# Patient Record
Sex: Male | Born: 1960
Health system: Southern US, Community
[De-identification: ages and names within clinical notes are randomized; demographics above are authoritative.]

## PROBLEM LIST (undated history)

## (undated) DIAGNOSIS — R0789 Other chest pain: Secondary | ICD-10-CM

## (undated) DIAGNOSIS — E7800 Pure hypercholesterolemia, unspecified: Secondary | ICD-10-CM

## (undated) DIAGNOSIS — K219 Gastro-esophageal reflux disease without esophagitis: Secondary | ICD-10-CM

## (undated) DIAGNOSIS — R51 Headache: Secondary | ICD-10-CM

## (undated) DIAGNOSIS — G8929 Other chronic pain: Secondary | ICD-10-CM

## (undated) DIAGNOSIS — R569 Unspecified convulsions: Secondary | ICD-10-CM

## (undated) HISTORY — PX: MENISCUS REPAIR: SHX5179

## (undated) HISTORY — DX: Unspecified convulsions: R56.9

## (undated) HISTORY — DX: Other chest pain: R07.89

## (undated) HISTORY — DX: Pure hypercholesterolemia, unspecified: E78.00

## (undated) HISTORY — PX: ANKLE SURGERY: SHX546

## (undated) HISTORY — DX: Headache: R51

## (undated) HISTORY — PX: FINGER SURGERY: SHX640

## (undated) HISTORY — DX: Gastro-esophageal reflux disease without esophagitis: K21.9

## (undated) HISTORY — DX: Other chronic pain: G89.29

---

## 2002-02-19 ENCOUNTER — Other Ambulatory Visit: Admission: RE | Admit: 2002-02-19 | Discharge: 2002-02-19 | Payer: Self-pay | Admitting: Urology

## 2002-08-09 ENCOUNTER — Emergency Department (HOSPITAL_COMMUNITY): Admission: EM | Admit: 2002-08-09 | Discharge: 2002-08-09 | Payer: Self-pay | Admitting: Emergency Medicine

## 2002-08-09 ENCOUNTER — Encounter: Payer: Self-pay | Admitting: Emergency Medicine

## 2008-02-12 ENCOUNTER — Ambulatory Visit: Payer: Self-pay | Admitting: Family Medicine

## 2008-07-13 ENCOUNTER — Ambulatory Visit: Payer: Self-pay | Admitting: Family Medicine

## 2008-07-15 ENCOUNTER — Ambulatory Visit (HOSPITAL_BASED_OUTPATIENT_CLINIC_OR_DEPARTMENT_OTHER): Admission: RE | Admit: 2008-07-15 | Discharge: 2008-07-15 | Payer: Self-pay | Admitting: *Deleted

## 2009-02-05 ENCOUNTER — Ambulatory Visit: Payer: Self-pay | Admitting: Family Medicine

## 2009-05-10 ENCOUNTER — Ambulatory Visit: Admission: RE | Admit: 2009-05-10 | Discharge: 2009-05-10 | Payer: Self-pay | Admitting: Orthopedic Surgery

## 2009-05-10 ENCOUNTER — Ambulatory Visit: Payer: Self-pay | Admitting: Vascular Surgery

## 2009-05-10 ENCOUNTER — Encounter (INDEPENDENT_AMBULATORY_CARE_PROVIDER_SITE_OTHER): Payer: Self-pay | Admitting: Orthopedic Surgery

## 2009-12-07 ENCOUNTER — Ambulatory Visit: Payer: Self-pay | Admitting: Family Medicine

## 2011-03-09 ENCOUNTER — Ambulatory Visit (INDEPENDENT_AMBULATORY_CARE_PROVIDER_SITE_OTHER): Payer: BC Managed Care – PPO | Admitting: Family Medicine

## 2011-03-09 DIAGNOSIS — L258 Unspecified contact dermatitis due to other agents: Secondary | ICD-10-CM

## 2011-05-09 NOTE — Op Note (Signed)
NAMEFENTON, CANDEE NO.:  [id number]   MEDICAL RECORD NO.:  [id number]          PATIENT TYPE:  AMB   LOCATION:  DSC                          FACILITY:  [facility]   PHYSICIAN:  Tennis Must Meyerdierks, M.D.DATE OF BIRTH:  1961/01/27   DATE OF PROCEDURE:  07/15/2008  DATE OF DISCHARGE:                               OPERATIVE REPORT   PREOPERATIVE DIAGNOSIS:  Lacerated ulnar digital artery and nerve, left  index finger.   POSTOPERATIVE DIAGNOSIS:  Lacerated ulnar digital artery and nerve, left  index finger.   PROCEDURE:  Repair of ulnar digital artery and nerve, left index finger.   SURGEON:  Lowell Bouton, MD   ANESTHESIA:  General.   OPERATIVE FINDINGS:  The patient had an oblique laceration over the  ulnar volar aspect of the PIP joint of the left index finger.  His  flexor tendons were both intact, and his ulnar digital artery and nerve  were completely transected.   PROCEDURE IN DETAIL:  Under general anesthesia with the tourniquet on  the left arm, the left hand was prepped and draped in the usual fashion,  and after exsanguinating the limb, the tourniquet was inflated to 250  mmHg.  The laceration was extended in a zigzag fashion proximally and  distally, and blunt dissection was carried down through the subcutaneous  tissues.  The 4-0 silk retention sutures were placed in the flaps for  retraction.  Blunt dissection was carried down to the neurovascular  bundle, which was easily identified and was found to be completely  transected.  The microscope was then brought into the field, and the  artery was cleaned of its adventitia.  It was dilated with __________  solution and a dilator.  The ends of the artery were cut back to good  intima.  The digital nerve likewise was trimmed back to good fascicles  using the straight microscissors.  Using the microscope, the digital  artery was repaired with 9-0 nylon interrupted sutures.  It was  repaired  circumferentially, and there did not appear to be any gaps in the  repair.  The ulnar digital nerve was then repaired using 9-0 nylon  interrupted sutures.  This was repaired circumferentially also.  The  tourniquet was then released.  Venous bleeding was controlled with  electrocautery.  There was good circulation to the finger and no leakage  from the anastomosis.  The wound was then closed with 4-0 nylon sutures.  Sterile dressings were applied followed by an Alumafoam splint with the  finger in slight flexion at the PIP joint.  The patient tolerated the  procedure well and went to the recovery room awake, in stable and good  condition.      Lowell Bouton, M.D.  Electronically Signed    EMM/MEDQ  D:  07/15/2008  T:  07/16/2008  Job:  [id number]   cc:   Sharlot Gowda, M.D.

## 2011-09-22 LAB — POCT HEMOGLOBIN-HEMACUE: Hemoglobin: 14.3

## 2011-09-25 ENCOUNTER — Emergency Department (HOSPITAL_COMMUNITY): Payer: BC Managed Care – PPO

## 2011-09-25 ENCOUNTER — Observation Stay (HOSPITAL_COMMUNITY)
Admission: EM | Admit: 2011-09-25 | Discharge: 2011-09-25 | Disposition: A | Discharge status: Home / Self Care | Payer: BC Managed Care – PPO | Source: Ambulatory Visit | Attending: Emergency Medicine | Admitting: Emergency Medicine

## 2011-09-25 DIAGNOSIS — R079 Chest pain, unspecified: Principal | ICD-10-CM | POA: Insufficient documentation

## 2011-09-25 LAB — CK TOTAL AND CKMB (NOT AT ARMC)
CK, MB: 3.6 ng/mL (ref 0.3–4.0)
Relative Index: 2.4 (ref 0.0–2.5)
Relative Index: 2.5 (ref 0.0–2.5)
Total CK: 126 U/L (ref 7–232)

## 2011-09-25 LAB — POCT I-STAT TROPONIN I
Troponin i, poc: 0 ng/mL (ref 0.00–0.08)
Troponin i, poc: 0 ng/mL (ref 0.00–0.08)

## 2011-09-25 LAB — POCT I-STAT, CHEM 8
BUN: 25 mg/dL — ABNORMAL HIGH (ref 6–23)
Chloride: 106 mEq/L (ref 96–112)
Creatinine, Ser: 1.1 mg/dL (ref 0.50–1.35)
Glucose, Bld: 89 mg/dL (ref 70–99)
HCT: 38 % — ABNORMAL LOW (ref 39.0–52.0)
Hemoglobin: 12.9 g/dL — ABNORMAL LOW (ref 13.0–17.0)
Potassium: 3.6 mEq/L (ref 3.5–5.1)
Sodium: 141 mEq/L (ref 135–145)

## 2011-09-25 LAB — CBC
HCT: 37.4 % — ABNORMAL LOW (ref 39.0–52.0)
Hemoglobin: 12.9 g/dL — ABNORMAL LOW (ref 13.0–17.0)
MCH: 29.5 pg (ref 26.0–34.0)
MCV: 85.6 fL (ref 78.0–100.0)
Platelets: 216 10*3/uL (ref 150–400)
RDW: 12.8 % (ref 11.5–15.5)
WBC: 5.9 10*3/uL (ref 4.0–10.5)

## 2011-09-25 LAB — DIFFERENTIAL
Basophils Absolute: 0 10*3/uL (ref 0.0–0.1)
Basophils Relative: 0 % (ref 0–1)
Eosinophils Absolute: 0.2 10*3/uL (ref 0.0–0.7)
Eosinophils Relative: 4 % (ref 0–5)
Lymphocytes Relative: 39 % (ref 12–46)
Lymphs Abs: 2.3 10*3/uL (ref 0.7–4.0)
Monocytes Relative: 9 % (ref 3–12)
Neutro Abs: 2.8 10*3/uL (ref 1.7–7.7)
Neutrophils Relative %: 48 % (ref 43–77)

## 2011-09-25 MED ORDER — IOHEXOL 350 MG/ML SOLN
80.0000 mL | Freq: Once | INTRAVENOUS | Status: AC | PRN
  Administered 2011-09-25: 80 mL via INTRAVENOUS

## 2011-11-29 ENCOUNTER — Encounter: Payer: Self-pay | Admitting: Nurse Practitioner

## 2011-11-29 ENCOUNTER — Encounter: Payer: Self-pay | Admitting: Medical

## 2011-11-29 ENCOUNTER — Ambulatory Visit (INDEPENDENT_AMBULATORY_CARE_PROVIDER_SITE_OTHER): Payer: BC Managed Care – PPO | Admitting: Medical

## 2011-11-29 ENCOUNTER — Encounter: Payer: BC Managed Care – PPO | Admitting: Nurse Practitioner

## 2011-11-29 VITALS — BP 130/80 | Temp 97.7°F | Wt 196.0 lb

## 2011-11-29 DIAGNOSIS — L259 Unspecified contact dermatitis, unspecified cause: Secondary | ICD-10-CM | POA: Insufficient documentation

## 2011-11-29 DIAGNOSIS — N390 Urinary tract infection, site not specified: Secondary | ICD-10-CM

## 2011-11-29 MED ORDER — PREDNISONE 10 MG PO TABS: ORAL_TABLET | ORAL | Status: DC

## 2011-11-29 NOTE — Patient Instructions (Signed)
Poison Ivy Poison ivy is a inflammation of the skin (contact dermatitis) caused by touching the allergens on the leaves of the ivy plant following previous exposure to the plant. The rash usually appears 48 hours after exposure. The rash is usually bumps (papules) or blisters (vesicles) in a linear pattern. Depending on your own sensitivity, the rash may simply cause redness and itching, or it may also progress to blisters which may break open. These must be well cared for to prevent secondary bacterial (germ) infection, followed by scarring. Keep any open areas dry, clean, dressed, and covered with an antibacterial ointment if needed. The eyes may also get puffy. The puffiness is worst in the morning and gets better as the day progresses. This dermatitis usually heals without scarring, within 2 to 3 weeks without treatment. HOME CARE INSTRUCTIONS  Thoroughly wash with soap and water as soon as you have been exposed to poison ivy. You have about one half hour to remove the plant resin before it will cause the rash. This washing will destroy the oil or antigen on the skin that is causing, or will cause, the rash. Be sure to wash under your fingernails as any plant resin there will continue to spread the rash. Do not rub skin vigorously when washing affected area. Poison ivy cannot spread if no oil from the plant remains on your body. A rash that has progressed to weeping sores will not spread the rash unless you have not washed thoroughly. It is also important to wash any clothes you have been wearing as these may carry active allergens. The rash will return if you wear the unwashed clothing, even several days later. Avoidance of the plant in the future is the best measure. Poison ivy plant can be recognized by the number of leaves. Generally, poison ivy has three leaves with flowering branches on a single stem. Diphenhydramine may be purchased over the counter and used as needed for itching. Do not drive with  this medication if it makes you drowsy.Ask your caregiver about medication for children. SEEK MEDICAL CARE IF:  Open sores develop.   Redness spreads beyond area of rash.   You notice purulent (pus-like) discharge.   You have increased pain.   Other signs of infection develop (such as fever).  Document Released: 12/08/2000 Document Revised: 08/23/2011 Document Reviewed: 10/27/2009 [employer] Patient Information 2012 [employer]. 

## 2011-11-29 NOTE — Progress Notes (Signed)
  Subjective:     Mitchell Ruiz is a 50 y.o. male who presents for evaluation of a rash involving the chest, forearm and trunk. Rash started 2 days ago. Lesions are pink, and raised in texture. Rash has changed over time. Rash is pruritic. Associated symptoms: oozing from left forearm rash. Patient denies: fever and myalgia.  Patient has had new exposures - was out in yard over the weekend pulling weeds, and didn't take a shower that evening.  Next day had rash. He does have hx/o poison ivy reaction and usually has to have shot and steroid taper.  Benadryl makes him jittery.  Review of Systems Pertinent items are noted in HPI.      Objective:    Filed Vitals:   11/29/11 1631  BP: 130/80  Temp: 97.7 F (36.5 C)    General appearance: alert, no distress, WD/WN, male  Skin: several patches of erythema, wheals along left forearm, similar rash on left abdomen and right arm, left forearm rash with serous oozing    Assessment:   Contact Dermatitis    Plan:   Depo Medrol 80 mg IM in office.  Script for Prednisone taper.  Avoid triggers, plants.  Call if worse or not improving.

## 2011-12-01 DIAGNOSIS — L259 Unspecified contact dermatitis, unspecified cause: Secondary | ICD-10-CM

## 2011-12-01 MED ORDER — SODIUM CHLORIDE 0.9 % IV SOLN
80.0000 mg | Freq: Once | INTRAVENOUS | Status: AC
  Administered 2011-11-29: 80 mg via INTRAVENOUS

## 2011-12-01 MED ORDER — METHYLPREDNISOLONE ACETATE 40 MG/ML IJ SUSP
80.0000 mg | Freq: Once | INTRAMUSCULAR | Status: AC
  Administered 2011-12-01: 80 mg via INTRAMUSCULAR

## 2011-12-01 NOTE — Progress Notes (Signed)
Addended by: Janeice Robinson on: 12/01/2011 02:13 PM   Modules accepted: Orders

## 2011-12-01 NOTE — Progress Notes (Signed)
Addended by: Janeice Robinson on: 12/01/2011 02:32 PM   Modules accepted: Orders

## 2011-12-07 ENCOUNTER — Encounter: Payer: Self-pay | Admitting: Internal Medicine

## 2011-12-08 ENCOUNTER — Ambulatory Visit (INDEPENDENT_AMBULATORY_CARE_PROVIDER_SITE_OTHER): Payer: BC Managed Care – PPO | Admitting: Family Medicine

## 2011-12-08 ENCOUNTER — Encounter: Payer: Self-pay | Admitting: Family Medicine

## 2011-12-08 VITALS — BP 120/80 | HR 78 | Wt 195.0 lb

## 2011-12-08 DIAGNOSIS — L259 Unspecified contact dermatitis, unspecified cause: Secondary | ICD-10-CM

## 2011-12-08 DIAGNOSIS — Z23 Encounter for immunization: Secondary | ICD-10-CM

## 2011-12-08 MED ORDER — TRIAMCINOLONE ACETONIDE 0.5 % EX CREA: TOPICAL_CREAM | Freq: Three times a day (TID) | CUTANEOUS | Status: DC

## 2011-12-08 MED ORDER — HYDROXYZINE HCL 10 MG PO TABS: 10.0000 mg | ORAL_TABLET | Freq: Three times a day (TID) | ORAL | Status: DC | PRN

## 2011-12-08 NOTE — Progress Notes (Signed)
  Subjective:    Patient ID: Mitchell Ruiz, male    DOB: October 26, 1961, 50 y.o.   MRN: [id number]  HPI He is here for reevaluation of a rash. He has been reexposed to poison ivy and now has some lesions present on his left flank and left forearm.  Review of Systems     Objective:   Physical Exam Erythematous linear lesions are noted on the left flank and a more confluent area 3 x 8 cm is noted on the left forearm.       Assessment & Plan:   1. Contact dermatitis    I will give him triamcinolone cream and Atarax tablet this. He wanted a steroid dose pack however I was reluctant to do it since he recently had one.

## 2011-12-08 NOTE — Patient Instructions (Signed)
Use the cream sparingly. Use the Atarax especially at night.

## 2012-05-10 ENCOUNTER — Ambulatory Visit (INDEPENDENT_AMBULATORY_CARE_PROVIDER_SITE_OTHER): Payer: BC Managed Care – PPO | Admitting: Medical

## 2012-05-10 ENCOUNTER — Encounter: Payer: Self-pay | Admitting: Medical

## 2012-05-10 VITALS — BP 110/80 | HR 80 | Temp 98.4°F | Wt 200.0 lb

## 2012-05-10 DIAGNOSIS — L255 Unspecified contact dermatitis due to plants, except food: Secondary | ICD-10-CM

## 2012-05-10 MED ORDER — HYDROXYZINE HCL 10 MG PO TABS: 10.0000 mg | ORAL_TABLET | Freq: Three times a day (TID) | ORAL | Status: AC | PRN

## 2012-05-10 MED ORDER — METHYLPREDNISOLONE 4 MG PO KIT: PACK | ORAL | Status: AC

## 2012-05-10 MED ORDER — TRIAMCINOLONE ACETONIDE 0.5 % EX CREA: TOPICAL_CREAM | Freq: Three times a day (TID) | CUTANEOUS | Status: DC

## 2012-05-10 NOTE — Progress Notes (Signed)
Subjective:   HPI  Mitchell Ruiz is a 51 y.o. male who presents with possible poison ivy exposure.  He reports hx/o similar.  He notes that he was working to pull weeds few days ago, was wearing shorts and T-shirt, and next day started getting rash that has now spread.  Has rash on arms, legs, right abdomen.  Using nothing currently.  In the past has come here for treatment. No other aggravating or relieving factors.    No other c/o.  The following portions of the patient's history were reviewed and updated as appropriate: allergies, current medications, past family history, past medical history, past social history, past surgical history and problem list.  Past Medical History  Diagnosis Date  . Chest discomfort   . High cholesterol     No Known Allergies   Review of Systems ROS reviewed and was negative other than noted in HPI or above.    Objective:   Physical Exam  General appearance: alert, no distress, WD/WN Skin: scattered linear erythematous lesions somewhat whealed lesions on bilat arms, legs and right lower abdomen   Assessment and Plan :     Encounter Diagnosis  Name Primary?  . Plant dermatitis Yes   Scripts for Medrol Dosepak, hydroxyzine, topical triamcinolone.   Avoid reexposure, call if worse or not improving.

## 2012-07-25 NOTE — ED Provider Notes (Signed)
Order(s) created erroneously. Erroneous order ID: [id number] Order moved by: Lurline Hare Order move date/time: 07/25/2012  2:10 PM Source Patient:    [id number] Source Contact: 11/29/2011 Destination Patient:   [id number] Destination Contact: 10/02/2011

## 2012-08-29 ENCOUNTER — Telehealth: Payer: Self-pay

## 2012-08-29 ENCOUNTER — Encounter: Payer: Self-pay | Admitting: Family Medicine

## 2012-08-29 ENCOUNTER — Ambulatory Visit (INDEPENDENT_AMBULATORY_CARE_PROVIDER_SITE_OTHER): Payer: BC Managed Care – PPO | Admitting: Family Medicine

## 2012-08-29 ENCOUNTER — Encounter: Payer: Self-pay | Admitting: Gastroenterology

## 2012-08-29 VITALS — BP 130/80 | HR 60 | Ht 70.5 in | Wt 194.0 lb

## 2012-08-29 DIAGNOSIS — Z125 Encounter for screening for malignant neoplasm of prostate: Secondary | ICD-10-CM

## 2012-08-29 DIAGNOSIS — Z23 Encounter for immunization: Secondary | ICD-10-CM

## 2012-08-29 DIAGNOSIS — R0789 Other chest pain: Secondary | ICD-10-CM

## 2012-08-29 DIAGNOSIS — Z0000 Encounter for general adult medical examination without abnormal findings: Secondary | ICD-10-CM

## 2012-08-29 DIAGNOSIS — K219 Gastro-esophageal reflux disease without esophagitis: Secondary | ICD-10-CM

## 2012-08-29 DIAGNOSIS — E785 Hyperlipidemia, unspecified: Secondary | ICD-10-CM

## 2012-08-29 LAB — COMPREHENSIVE METABOLIC PANEL
AST: 19 U/L (ref 0–37)
Albumin: 4.5 g/dL (ref 3.5–5.2)
Alkaline Phosphatase: 39 U/L (ref 39–117)
BUN: 20 mg/dL (ref 6–23)
Potassium: 4.5 mEq/L (ref 3.5–5.3)
Sodium: 140 mEq/L (ref 135–145)
Total Bilirubin: 0.6 mg/dL (ref 0.3–1.2)
Total Protein: 7.1 g/dL (ref 6.0–8.3)

## 2012-08-29 LAB — CBC WITH DIFFERENTIAL/PLATELET
Basophils Absolute: 0 10*3/uL (ref 0.0–0.1)
Basophils Relative: 0 % (ref 0–1)
Eosinophils Absolute: 0.1 10*3/uL (ref 0.0–0.7)
MCH: 28.5 pg (ref 26.0–34.0)
MCHC: 33.7 g/dL (ref 30.0–36.0)
Neutro Abs: 3.4 10*3/uL (ref 1.7–7.7)
Neutrophils Relative %: 65 % (ref 43–77)
RDW: 13.4 % (ref 11.5–15.5)

## 2012-08-29 LAB — LIPID PANEL
HDL: 44 mg/dL (ref >39)
LDL Cholesterol: 185 mg/dL — ABNORMAL HIGH (ref 0–99)
Triglycerides: 106 mg/dL (ref <150)
VLDL: 21 mg/dL (ref 0–40)

## 2012-08-29 LAB — PSA: PSA: 0.35 ng/mL (ref <=4.00)

## 2012-08-29 NOTE — Telephone Encounter (Signed)
COLONOSCOPY OCT 28TH AT 10 AM ARRIVE AT 9 AM DR JACOB PRE OP OCT 14 AT 10 30 [place] [phone]

## 2012-08-29 NOTE — Patient Instructions (Signed)
If you have this chest pain again especially after working in the yard take 4 ibuprofen 3 times per day

## 2012-08-29 NOTE — Progress Notes (Signed)
Subjective:    Patient ID: Mitchell Ruiz, male    DOB: 1961/07/21, 51 y.o.   MRN: [id number]  HPI He is here for complete examination. He was seen in October of last year for evaluation of chest discomfort. That record was reviewed and no cardiac issues were identified. He was especially sensitized the cause a friend of his apparently died of heart-related issues in his 29s. He does describe pain when he lays on his chest or on the left side. He also notes increased discomfort after he finishes doing some yard work but he has had no pain with exercise, shortness of breath, diaphoresis. He states pain can be intermittent in nature. He does have reflux and uses Zegerid for this. Review his record also indicates elevated lipid panel. Social and family history were reviewed. His work is going well.   Review of Systems  Constitutional: Negative.   HENT: Negative.   Eyes: Negative.   Respiratory: Negative.   Cardiovascular: Negative.   Gastrointestinal: Negative.   Genitourinary: Negative.   Musculoskeletal: Negative.   Neurological: Negative.   Hematological: Negative.   Psychiatric/Behavioral: Negative.        Objective:   Physical Exam BP 130/80  Pulse 60  Ht 5' 10.5" (1.791 m)  Wt 194 lb (87.998 kg)  BMI 27.44 kg/m2  General Appearance:    Alert, cooperative, no distress, appears stated age  Head:    Normocephalic, without obvious abnormality, atraumatic  Eyes:    PERRL, conjunctiva/corneas clear, EOM's intact, fundi    benign  Ears:    Normal TM's and external ear canals  Nose:   Nares normal, mucosa normal, no drainage or sinus   tenderness  Throat:   Lips, mucosa, and tongue normal; teeth and gums normal  Neck:   Supple, no lymphadenopathy;  thyroid:  no   enlargement/tenderness/nodules; no carotid   bruit or JVD  Back:    Spine nontender, no curvature, ROM normal, no CVA     tenderness  Lungs:     Clear to auscultation bilaterally without wheezes, rales or     ronchi;  respirations unlabored  Chest Wall:    No tenderness or deformity   Heart:    Regular rate and rhythm, S1 and S2 normal, no murmur, rub   or gallop  Breast Exam:    No chest wall tenderness, masses or gynecomastia  Abdomen:     Soft, non-tender, nondistended, normoactive bowel sounds,    no masses, no hepatosplenomegaly  Genitalia:    Normal male external genitalia without lesions.  Testicles without masses.  No inguinal hernias.  Rectal:   deferred   Extremities:   No clubbing, cyanosis or edema  Pulses:   2+ and symmetric all extremities  Skin:   Skin color, texture, turgor normal, no rashes or lesions  Lymph nodes:   Cervical, supraclavicular, and axillary nodes normal  Neurologic:   CNII-XII intact, normal strength, sensation and gait; reflexes 2+ and symmetric throughout          Psych:   Normal mood, affect, hygiene and grooming.           Assessment & Plan:   1. GERD (gastroesophageal reflux disease)    2. Routine general medical examination at a health care facility  Tdap vaccine greater than or equal to 7yo IM, Flu vaccine greater than or equal to 3yo with preservative IM, HM COLONOSCOPY, CBC with Differential, Comprehensive metabolic panel, Lipid panel  3. Special screening for malignant neoplasm of  prostate  PSA  4. Hyperlipidemia LDL goal < 100  Lipid panel  5. Musculoskeletal chest pain     discussed in great detail the musculoskeletal pain. Explained to him that there was absolutely no evidence of any major problems especially since this is intermittent in nature.

## 2012-10-07 ENCOUNTER — Telehealth: Payer: Self-pay | Admitting: *Deleted

## 2012-10-07 NOTE — Telephone Encounter (Signed)
No show for previsit.  Left message on machine and sent no show letter

## 2012-10-21 ENCOUNTER — Other Ambulatory Visit: Payer: BC Managed Care – PPO | Admitting: Gastroenterology

## 2012-10-28 ENCOUNTER — Ambulatory Visit (INDEPENDENT_AMBULATORY_CARE_PROVIDER_SITE_OTHER): Payer: BC Managed Care – PPO | Admitting: Family Medicine

## 2012-10-28 VITALS — BP 120/80 | HR 78 | Temp 98.7°F | Wt 194.0 lb

## 2012-10-28 DIAGNOSIS — R05 Cough: Secondary | ICD-10-CM

## 2012-10-28 DIAGNOSIS — R059 Cough, unspecified: Secondary | ICD-10-CM

## 2012-10-28 MED ORDER — ALBUTEROL SULFATE HFA 108 (90 BASE) MCG/ACT IN AERS: 2.0000 | INHALATION_SPRAY | Freq: Four times a day (QID) | RESPIRATORY_TRACT | Status: DC | PRN

## 2012-10-28 MED ORDER — CLARITHROMYCIN 500 MG PO TABS: 500.0000 mg | ORAL_TABLET | Freq: Two times a day (BID) | ORAL | Status: DC

## 2012-10-28 NOTE — Patient Instructions (Signed)
If you're still having symptoms in 2 weeks, make another appointment

## 2012-10-28 NOTE — Progress Notes (Signed)
  Subjective:    Patient ID: ELIODORO GULLETT, male    DOB: 10/31/1961, 51 y.o.   MRN: [id number]  HPI He has a two-week history that started with a cough but no fever, chills, sore throat. He notes that he is usually fine in the morning but as the day goes on he has more difficulty with coughing. No sneezing, itchy watery eyes, rhinorrhea. He presently is taking Zegerid. He will sometimes take Zantac if he has extra indigestion. Cough is intermittent.   Review of Systems     Objective:   Physical Exam alert and in no distress. Tympanic membranes and canals are normal. Throat is clear. Tonsils are normal. Neck is supple without adenopathy or thyromegaly. Cardiac exam shows a regular sinus rhythm without murmurs or gallops. Lungs are clear to auscultation. Chest x-ray shows no acute changes      Assessment & Plan:   1. Cough  clarithromycin (BIAXIN) 500 MG tablet, albuterol (PROVENTIL HFA;VENTOLIN HFA) 108 (90 BASE) MCG/ACT inhaler, CHEST X-RAY 2 VIEWS (71020)   I explained to him that his cough is really not diagnostic of anything in particular but will she will an antibiotic and using an inhaler will do. If he makes no improvement I will refer to pulmonary

## 2014-02-20 ENCOUNTER — Other Ambulatory Visit: Payer: Self-pay | Admitting: Specialist

## 2014-02-20 DIAGNOSIS — M5412 Radiculopathy, cervical region: Secondary | ICD-10-CM

## 2014-02-20 DIAGNOSIS — R200 Anesthesia of skin: Secondary | ICD-10-CM

## 2014-02-20 DIAGNOSIS — R531 Weakness: Secondary | ICD-10-CM

## 2014-02-24 ENCOUNTER — Other Ambulatory Visit: Payer: BC Managed Care – PPO

## 2014-05-08 ENCOUNTER — Encounter: Payer: Self-pay | Admitting: Family Medicine

## 2014-05-08 ENCOUNTER — Ambulatory Visit (INDEPENDENT_AMBULATORY_CARE_PROVIDER_SITE_OTHER): Payer: BC Managed Care – PPO | Admitting: Family Medicine

## 2014-05-08 VITALS — BP 120/84 | HR 78 | Resp 16 | Ht 70.0 in | Wt 191.0 lb

## 2014-05-08 DIAGNOSIS — G589 Mononeuropathy, unspecified: Secondary | ICD-10-CM

## 2014-05-08 DIAGNOSIS — G629 Polyneuropathy, unspecified: Secondary | ICD-10-CM

## 2014-05-08 NOTE — Progress Notes (Signed)
   Subjective:    Patient ID: Mitchell Ruiz, male    DOB: [birth year], 53 y.o.   MRN: [id number]  HPI He is here for consult concerning difficulty he has had over the last several years. He is being evaluated and treated by a neurologist in [place]. The medical record indicates multiple issues including neuropathy of various kinds and trigger point injections. He is also had x-rays and MRIs done. Recently he was tried on Lyrica however he did not tolerate it. He was recommended to be switched to gabapentin. He is concerned and frustrated over his care and feels as if the neurologist is not giving him the time necessary.   Review of Systems     Objective:   Physical Exam Alert and in no distress otherwise not examined       Assessment & Plan:  Neuropathy - Plan: Ambulatory referral to Neurology  review of the neurology notes does indicate some nonspecific findings and some benefit from trigger point injections however the exact diagnosis from my perspective does not well-defined. I will refer him to neurology to get a second opinion and probably take over his care. I did recommend that he switch over to gabapentin to see if this will help with his pain symptoms.

## 2014-06-10 ENCOUNTER — Ambulatory Visit: Payer: BC Managed Care – PPO | Admitting: Neurology

## 2014-07-17 ENCOUNTER — Telehealth: Payer: Self-pay | Admitting: Neurology

## 2014-07-17 ENCOUNTER — Telehealth: Payer: Self-pay | Admitting: Internal Medicine

## 2014-07-17 NOTE — Telephone Encounter (Signed)
Pt called on 07/17/14 at 10:11AM to cancel his initial appt with Dr. Posey Pronto on 07/20/14. He stated he felt fine and does not feel the need to be seen by a neurologist. I called Ref provider, Dr. Beverly Gust office and spoke w/ Gabriel Cirri to inform her on his cancellation on 07/17/14 at 10:15AM

## 2014-07-17 NOTE — Telephone Encounter (Signed)
Pt cancelled his appt with Dr. Posey Pronto for neurology appt. Just an [place]

## 2014-07-20 ENCOUNTER — Ambulatory Visit: Payer: BC Managed Care – PPO | Admitting: Neurology

## 2014-07-28 ENCOUNTER — Encounter (HOSPITAL_COMMUNITY): Payer: Self-pay | Admitting: Emergency Medicine

## 2014-07-28 ENCOUNTER — Emergency Department (HOSPITAL_COMMUNITY): Payer: BC Managed Care – PPO

## 2014-07-28 ENCOUNTER — Telehealth: Payer: Self-pay | Admitting: Family Medicine

## 2014-07-28 ENCOUNTER — Ambulatory Visit: Payer: BC Managed Care – PPO | Admitting: Family Medicine

## 2014-07-28 ENCOUNTER — Emergency Department (HOSPITAL_COMMUNITY)
Admission: EM | Admit: 2014-07-28 | Discharge: 2014-07-28 | Disposition: A | Discharge status: Home / Self Care | Payer: BC Managed Care – PPO | Attending: Emergency Medicine | Admitting: Emergency Medicine

## 2014-07-28 DIAGNOSIS — Z7982 Long term (current) use of aspirin: Secondary | ICD-10-CM | POA: Insufficient documentation

## 2014-07-28 DIAGNOSIS — R51 Headache: Secondary | ICD-10-CM | POA: Insufficient documentation

## 2014-07-28 DIAGNOSIS — Z79899 Other long term (current) drug therapy: Secondary | ICD-10-CM | POA: Insufficient documentation

## 2014-07-28 DIAGNOSIS — R42 Dizziness and giddiness: Secondary | ICD-10-CM | POA: Insufficient documentation

## 2014-07-28 DIAGNOSIS — R519 Headache, unspecified: Secondary | ICD-10-CM

## 2014-07-28 DIAGNOSIS — Z862 Personal history of diseases of the blood and blood-forming organs and certain disorders involving the immune mechanism: Secondary | ICD-10-CM | POA: Insufficient documentation

## 2014-07-28 DIAGNOSIS — Z8639 Personal history of other endocrine, nutritional and metabolic disease: Secondary | ICD-10-CM | POA: Insufficient documentation

## 2014-07-28 MED ORDER — DEXAMETHASONE SODIUM PHOSPHATE 10 MG/ML IJ SOLN: 10.0000 mg | Freq: Once | INTRAMUSCULAR | Status: DC

## 2014-07-28 MED ORDER — SODIUM CHLORIDE 0.9 % IV BOLUS (SEPSIS)
1000.0000 mL | Freq: Once | INTRAVENOUS | Status: AC
  Administered 2014-07-28: 1000 mL via INTRAVENOUS

## 2014-07-28 MED ORDER — PROCHLORPERAZINE EDISYLATE 5 MG/ML IJ SOLN
10.0000 mg | Freq: Four times a day (QID) | INTRAMUSCULAR | Status: DC | PRN
  Administered 2014-07-28: 10 mg via INTRAVENOUS
  Filled 2014-07-28: qty 2

## 2014-07-28 MED ORDER — ONDANSETRON 4 MG PO TBDP
8.0000 mg | ORAL_TABLET | Freq: Once | ORAL | Status: AC
  Administered 2014-07-28: 8 mg via ORAL
  Filled 2014-07-28: qty 2

## 2014-07-28 MED ORDER — DEXAMETHASONE SODIUM PHOSPHATE 4 MG/ML IJ SOLN
10.0000 mg | Freq: Once | INTRAMUSCULAR | Status: AC
  Administered 2014-07-28: 10 mg via INTRAVENOUS
  Filled 2014-07-28: qty 3

## 2014-07-28 MED ORDER — OXYCODONE-ACETAMINOPHEN 5-325 MG PO TABS
1.0000 | ORAL_TABLET | Freq: Once | ORAL | Status: AC
  Administered 2014-07-28: 1 via ORAL
  Filled 2014-07-28: qty 1

## 2014-07-28 MED ORDER — DIPHENHYDRAMINE HCL 50 MG/ML IJ SOLN
25.0000 mg | Freq: Once | INTRAMUSCULAR | Status: AC
Start: 2014-07-28 — End: 2014-07-28
  Administered 2014-07-28: 25 mg via INTRAVENOUS
  Filled 2014-07-28: qty 1

## 2014-07-28 MED ORDER — STERILE WATER FOR INJECTION IJ SOLN
INTRAMUSCULAR | Status: AC
  Filled 2014-07-28: qty 20

## 2014-07-28 NOTE — Telephone Encounter (Signed)
Pt's wife called back again. I spoke to [facility] and he stated that if pt is too sick to come in the he needs to go to hospital. JCL states can't make any assessment over the phone. Pt's wife was informed of JCL recommendation.

## 2014-07-28 NOTE — Discharge Instructions (Signed)

## 2014-07-28 NOTE — ED Notes (Signed)
Pt transported to CT ?

## 2014-07-28 NOTE — ED Provider Notes (Signed)
CSN: [id number]     Arrival date & time 07/28/14  1615 History   First MD Initiated Contact with Patient 07/28/14 2028     Chief Complaint  Patient presents with  . Nausea  . Headache     (Consider location/radiation/quality/duration/timing/severity/associated sxs/prior Treatment) Patient is a 53 y.o. male presenting with headaches. The history is provided by the patient and the spouse.  Headache Pain location:  R parietal Quality:  Sharp Radiates to:  Does not radiate Severity currently:  8/10 Severity at highest:  10/10 Onset quality:  Gradual Duration:  8 hours Timing:  Constant Progression:  Worsening Chronicity:  New Similar to prior headaches: no   Context: bright light and loud noise   Context: not caffeine   Relieved by:  Nothing Worsened by:  Nothing tried Ineffective treatments:  Aspirin and NSAIDs Associated symptoms: dizziness   Associated symptoms: no abdominal pain, no congestion, no diarrhea, no fever, no myalgias and no vomiting    53 yo male with a significant past medical history of cluster headaches who presents with a chief complaint of headache and vomiting. Patient states this started earlier today as he was heading out for a meeting in another town. Patient states that during the drive he started having a dull right-sided headache which gradually worsened over the course of a couple hours. Patient stated at that point it was 10 out of 10 started having some vomiting with it. Patient went to a pharmacy and took 3 aspirin tablets without relief. Patient continuing to have pain throughout the day he decided to come to the emergency department for evaluation. Patient states that these headaches feel different than the ones he had prior. Patient has been 26 years without any headache. Pain worse with head movement some associated vertigo.  Headache has improved over the past hour or so. Denies dizziness currently. Denies recent head injury.  Past Medical History   Diagnosis Date  . Chest discomfort   . High cholesterol    Past Surgical History  Procedure Laterality Date  . Knee surgery     History reviewed. No pertinent family history. History  Substance Use Topics  . Smoking status: Never Smoker   . Smokeless tobacco: Not on file  . Alcohol Use: Yes     Comment: OCCASIONAL    Review of Systems  Constitutional: Negative for fever and chills.  HENT: Negative for congestion and facial swelling.   Eyes: Negative for discharge and visual disturbance.  Respiratory: Negative for shortness of breath.   Cardiovascular: Negative for chest pain and palpitations.  Gastrointestinal: Negative for vomiting, abdominal pain and diarrhea.  Musculoskeletal: Negative for arthralgias and myalgias.  Skin: Negative for color change and rash.  Neurological: Positive for dizziness and headaches. Negative for tremors and syncope.  Psychiatric/Behavioral: Negative for confusion and dysphoric mood.      Allergies  Review of patient's allergies indicates no known allergies.  Home Medications   Prior to Admission medications   Medication Sig Start Date End Date Taking? Authorizing Provider  ALPRAZolam Duanne Moron) 0.5 MG tablet Take 0.5 mg by mouth at bedtime as needed.  02/18/14  Yes Historical Provider, MD  aspirin 325 MG tablet Take 975 mg by mouth daily.   Yes Historical Provider, MD  omeprazole-sodium bicarbonate (ZEGERID) 40-1100 MG per capsule Take 1 capsule by mouth daily before breakfast.   Yes Historical Provider, MD   BP 104/70  Pulse 58  Resp 12  Ht 6' (1.829 m)  Wt 191 lb [phone]  kg)  BMI 25.90 kg/m2  SpO2 99% Physical Exam  Constitutional: He is oriented to person, place, and time. He appears well-developed and well-nourished.  HENT:  Head: Normocephalic and atraumatic.  Eyes: EOM are normal. Pupils are equal, round, and reactive to light.  Neck: Normal range of motion. Neck supple. No JVD present.  Cardiovascular: Normal rate and regular  rhythm.  Exam reveals no gallop and no friction rub.   No murmur heard. Pulmonary/Chest: No respiratory distress. He has no wheezes.  Abdominal: He exhibits no distension. There is no rebound and no guarding.  Musculoskeletal: Normal range of motion.  Neurological: He is alert and oriented to person, place, and time. He has normal strength. No cranial nerve deficit or sensory deficit. He displays a negative Romberg sign. Coordination and gait normal. GCS eye subscore is 4. GCS verbal subscore is 5. GCS motor subscore is 6.  Reflex Scores:      Tricep reflexes are 2+ on the right side and 2+ on the left side.      Bicep reflexes are 2+ on the right side and 2+ on the left side.      Brachioradialis reflexes are 2+ on the right side and 2+ on the left side.      Patellar reflexes are 2+ on the right side and 2+ on the left side.      Achilles reflexes are 2+ on the right side and 2+ on the left side. Skin: No rash noted. No pallor.  Psychiatric: He has a normal mood and affect. His behavior is normal.    ED Course  Procedures (including critical care time) Labs Review Labs Reviewed - No data to display  Imaging Review Ct Head Wo Contrast  07/28/2014   CLINICAL DATA:  Headache and nausea  EXAM: CT HEAD WITHOUT CONTRAST  TECHNIQUE: Contiguous axial images were obtained from the base of the skull through the vertex without intravenous contrast.  COMPARISON:  None.  FINDINGS: Ventricle size is normal. Negative for acute or chronic infarction. Negative for hemorrhage or fluid collection. Negative for mass or edema. No shift of the midline structures.  Calvarium is intact.  IMPRESSION: Normal   Electronically Signed   By: Franchot Gallo M.D.   On: 07/28/2014 21:48     EKG Interpretation None      MDM   Final diagnoses:  Frontal headache    53 yo male with a chief complaint of headache. Patient headache atypical of prior. Patient with a long time between headaches. Patient with some  associated pain with rotation of the head. We'll obtain a CT head give the patient a migraine cocktail.  Discussion with patient about the possiblity of SAH post 6 hours after CT.  Patient understands risks and benefits and able to talk about them intelligently.  Headache almost completely relieved with cocktail, requesting discharge at this time.   11:36 PM:  I have discussed the diagnosis/risks/treatment options with the patient and family and believe the pt to be eligible for discharge home to follow-up with PCP/neurologist. We also discussed returning to the ED immediately if new or worsening sx occur. We discussed the sx which are most concerning (e.g., neurologic deficit) that necessitate immediate return. Medications administered to the patient during their visit and any new prescriptions provided to the patient are listed below.  Medications given during this visit Medications  prochlorperazine (COMPAZINE) injection 10 mg (10 mg Intravenous Given 07/28/14 2117)  sterile water (preservative free) injection (not administered)  oxyCODONE-acetaminophen (  PERCOCET/ROXICET) 5-325 MG per tablet 1 tablet (1 tablet Oral Given 07/28/14 1645)  ondansetron (ZOFRAN-ODT) disintegrating tablet 8 mg (8 mg Oral Given 07/28/14 1645)  diphenhydrAMINE (BENADRYL) injection 25 mg (25 mg Intravenous Given 07/28/14 2117)  sodium chloride 0.9 % bolus 1,000 mL (0 mLs Intravenous Stopped 07/28/14 2210)  dexamethasone (DECADRON) injection 10 mg (10 mg Intravenous Given 07/28/14 2116)    New Prescriptions   No medications on file     Deno Etienne, MD 07/28/14 2336

## 2014-07-28 NOTE — ED Notes (Signed)
Pt no longer c/o headache.  Pt resting comfortably in bed. No new complaints.  Will continue to monitor.

## 2014-07-28 NOTE — ED Notes (Signed)
Pt reports that this afternoon around 1200 he started having a right sided headache with severe nausea. Denies any blurred vision, or light sensitivity. No neuro deficits.

## 2014-07-28 NOTE — Telephone Encounter (Signed)
Pt's wife called to cancel pt's appt for today. Pt too ill for her to get him here for appt. He has a severe headache and crying in bathroom floor. He is nauseated due to headache pain so cant stay out of bathroom. . Can Dr Redmond School send in med to [facility] to help? Already tried naproxen but it has not helped at all.

## 2014-08-07 NOTE — ED Provider Notes (Signed)
I saw and evaluated the patient, reviewed the resident's note and I agree with the findings and plan.   EKG Interpretation None      Pt comes in with headaches, sudden onset, new and severe. There is no focal neuro deficits on my exam. Pt has no hx of brain AN, and denies brain bleeds or aneurysms in the family. CT head done within 6 hours - and with a sensitivity of 98% for [facility], we decided to monitor the patient and get LP if not better, or any change in her pain. Pt eventually got a lot better, and asked for discharge home.  Varney Biles, MD 08/07/14 [phone]

## 2014-09-11 ENCOUNTER — Ambulatory Visit (INDEPENDENT_AMBULATORY_CARE_PROVIDER_SITE_OTHER): Payer: BC Managed Care – PPO | Admitting: Neurology

## 2014-09-11 ENCOUNTER — Encounter: Payer: Self-pay | Admitting: Neurology

## 2014-09-11 VITALS — BP 130/78 | HR 76 | Ht 71.0 in | Wt 195.0 lb

## 2014-09-11 DIAGNOSIS — R51 Headache: Secondary | ICD-10-CM

## 2014-09-11 DIAGNOSIS — R200 Anesthesia of skin: Secondary | ICD-10-CM | POA: Insufficient documentation

## 2014-09-11 DIAGNOSIS — R209 Unspecified disturbances of skin sensation: Secondary | ICD-10-CM

## 2014-09-11 NOTE — Progress Notes (Signed)
NEUROLOGY CONSULTATION NOTE  Mitchell Ruiz MRN: [id number] DOB: 05-07-1961  Referring provider: Dr. Jill Alexanders Primary care provider: Dr. Jill Alexanders  Reason for consult:  Headache, numbness  Dear Dr Redmond School:  Thank you for your kind referral of Mitchell Ruiz for consultation of the above symptoms. Although his history is well known to you, please allow me to reiterate it for the purpose of our medical record. Records and images were personally reviewed where available.  HISTORY OF PRESENT ILLNESS: This is a very pleasant 53 year old right-handed man with a history of GERD, presenting for numbness and headaches.  He reports that around 5-6 years ago, he started having pain on the left side of his chest, having the seatbelt on or lying on his left side was uncomfortable.  He had a cardiac evaluation which was normal per patient.  He was referred to a neurologist in [place], he tells me MRI brain and spine were unremarkable except for "something close to the vertebra." He received steroid injections in the neck, which did help with the left-sided symptoms.  At some point, he started having intermittent daily paresthesias in both legs, and received more injections. He noticed that hot water and heat seemed to exacerbate his symptoms.  He started having the paresthesias in his arms as well, and started Lyrica which did not seem to work.  He was switched to Gabapentin but "did not like it."  He stopped all medications 3-4 months ago and started feeling better, with no further paresthesias or pain. Last 07/28/2014, he was driving to a meeting when he started having a "tremendous headache" over the left peri-orbital and frontal region, radiating to the right occipital region. Pain was 10/10 with nausea, photophobia. He went to the ER, had a head CT which I personally reviewed which was normal. He was given a migraine cocktail and felt better. Lumbar puncture was discussed to r/o subarachnoid  hemorrhage, which he declined. He had a headache for 7-10 days after and took Naproxen twice a day. Headaches completely resolved after 2 weeks.  He denies any other neurological symptoms since then.  He denies any vision changes/visual obscurations, focal numbness/tingling/weakness, tinnitus, ear fullness. He has a history of migraines 20 years ago, different from the recent headaches.    He reports a history of 2 seizures, one 28 years ago while in a restaurant he started having deja vu, went to the bathroom, then regained consciousness in the car to the hospital. No clear reported convulsive activity. The second seizure occurred 15 years ago, he again recalls having deja vu then no other recollection of the seizure. He reports having an MRI at at that time which was normal.  He denies any further deja vu episodes. He denies any gaps in time, staring/unresponsive episodes, myoclonic jerks, olfactory/gustatory hallucinations.  He denies any dizziness, diplopia, dysarthria, dysphagia, neck pain, bowel/bladder dysfunction. He has low back pain with numbness radiating behind his right leg. He reports sleep can be better, usually with 5-6 hours of sleep, occasional snoring. His brother was diagnosed with MS 1-1/2 years ago. No family history of cerebral aneurysms or migraines.  He usually drinks 1-2 glasses of wine a few nights a week.  He reports having B12 shots last year and was treated for 21 days with antiviral due to "positive shingles test," no rash.  He had a normal birth and early development, no history of CNS infections, febrile convulsions, significant traumatic brain injury, family history of seizures.  PAST MEDICAL HISTORY: Past Medical History  Diagnosis Date  . Chest discomfort   . High cholesterol   . GERD (gastroesophageal reflux disease)     PAST SURGICAL HISTORY: Past Surgical History  Procedure Laterality Date  . Knee surgery Left   . Ankle surgery Right   . Finger surgery Left       nerve damage    MEDICATIONS: Current Outpatient Prescriptions on File Prior to Visit  Medication Sig Dispense Refill  . omeprazole-sodium bicarbonate (ZEGERID) 40-1100 MG per capsule Take 1 capsule by mouth daily before breakfast.       No current facility-administered medications on file prior to visit.    ALLERGIES: No Known Allergies  FAMILY HISTORY: Family History  Problem Relation Age of Onset  . Multiple sclerosis Brother     SOCIAL HISTORY: History   Social History  . Marital Status: Married    Spouse Name: N/A    Number of Children: N/A  . Years of Education: N/A   Occupational History  . Not on file.   Social History Main Topics  . Smoking status: Never Smoker   . Smokeless tobacco: Not on file  . Alcohol Use: Yes     Comment: OCCASIONAL  . Drug Use: No  . Sexual Activity: Yes   Other Topics Concern  . Not on file   Social History Narrative  . No narrative on file    REVIEW OF SYSTEMS: Constitutional: No fevers, chills, or sweats, no generalized fatigue, change in appetite Eyes: No visual changes, double vision, eye pain Ear, nose and throat: No hearing loss, ear pain, nasal congestion, sore throat Cardiovascular: No chest pain, palpitations Respiratory:  No shortness of breath at rest or with exertion, wheezes GastrointestinaI: No nausea, vomiting, diarrhea, abdominal pain, fecal incontinence Genitourinary:  No dysuria, urinary retention or frequency Musculoskeletal:  No neck pain, + back pain Integumentary: No rash, pruritus, skin lesions Neurological: as above Psychiatric: No depression, insomnia, anxiety Endocrine: No palpitations, fatigue, diaphoresis, mood swings, change in appetite, change in weight, increased thirst Hematologic/Lymphatic:  No anemia, purpura, petechiae. Allergic/Immunologic: no itchy/runny eyes, nasal congestion, recent allergic reactions, rashes  PHYSICAL EXAM: Filed Vitals:   09/11/14 0752  BP: 130/78  Pulse:  76   General: No acute distress Head:  Normocephalic/atraumatic Eyes: Fundoscopic exam shows bilateral sharp discs, no vessel changes, exudates, or hemorrhages Neck: supple, no paraspinal tenderness, full range of motion Back: No paraspinal tenderness Heart: regular rate and rhythm Lungs: Clear to auscultation bilaterally. Vascular: No carotid bruits. Skin/Extremities: No rash, no edema Neurological Exam: Mental status: alert and oriented to person, place, and time, no dysarthria or aphasia, Fund of knowledge is appropriate.  Recent and remote memory are intact.  Attention and concentration are normal.    Able to name objects and repeat phrases. Cranial nerves: CN I: not tested CN II: pupils equal, round and reactive to light, visual fields intact, fundi unremarkable. CN III, IV, VI:  full range of motion, no nystagmus, no ptosis CN V: facial sensation intact CN VII: upper and lower face symmetric CN VIII: hearing intact to finger rub CN IX, X: gag intact, uvula midline CN XI: sternocleidomastoid and trapezius muscles intact CN XII: tongue midline Bulk & Tone: normal, no fasciculations. Motor: 5/5 throughout with no pronator drift. Sensation: intact to light touch, cold, pin, vibration and joint position sense.  No extinction to double simultaneous stimulation.  Romberg test negative Deep Tendon Reflexes: +2 throughout, no ankle clonus Plantar responses: downgoing bilaterally  Cerebellar: no incoordination on finger to nose, heel to shin. No dysdiadochokinesia Gait: narrow-based and steady, able to tandem walk adequately. Tremor: none  IMPRESSION: This is a pleasant 53 year old right-handed man with a history of GERD, presenting for several neurological symptoms. He had intermittent paresthesias in all extremities a year ago, per patient MRI brain and spine done by his neurologist in [place] were unremarkable. He had tried Lyrica and Gabapentin with no effect, and self-discontinued  them.  Symptoms resolved 3-4 months ago.  Records and MRI disc will be requested for review.  He also reports a history of migraines 20 years ago, headache-free until last month when he had a severe right-sided headache. CT head normal, no further headaches.  Etiology unclear, possibly tension-type, migraine, less likely aneurysm. Imaging from previous neurologist will be reviewed, if no adequate vessel studies done, this will be ordered.  He has been seizure-free for 15 years, no anti-epileptic medications.  He had B12 replacement after report of low B12 level, this will be checked today.  He knows to call our office for any change in his symptoms and will follow-up in 3-4 months.  Thank you for allowing me to participate in the care of this patient. Please do not hesitate to call for any questions or concerns.   Ellouise Newer, M.D.  CC: Dr. Redmond School

## 2014-09-11 NOTE — Patient Instructions (Signed)
1. Bloodwork for B12 level 2. We will request records and copy of discs of scans done by [facility] and let you know if further studies are needed 3. Call our office for any problems 4. Follow-up in 3-4 months

## 2014-09-12 LAB — VITAMIN B12: Vitamin B-12: 378 pg/mL (ref 211–911)

## 2014-12-11 ENCOUNTER — Encounter: Payer: Self-pay | Admitting: Neurology

## 2014-12-11 ENCOUNTER — Ambulatory Visit (INDEPENDENT_AMBULATORY_CARE_PROVIDER_SITE_OTHER): Payer: BC Managed Care – PPO | Admitting: Neurology

## 2014-12-11 VITALS — BP 100/70 | HR 77 | Ht 71.0 in | Wt 199.2 lb

## 2014-12-11 DIAGNOSIS — R200 Anesthesia of skin: Secondary | ICD-10-CM

## 2014-12-11 DIAGNOSIS — M5010 Cervical disc disorder with radiculopathy, unspecified cervical region: Secondary | ICD-10-CM

## 2014-12-11 DIAGNOSIS — G44219 Episodic tension-type headache, not intractable: Secondary | ICD-10-CM

## 2014-12-11 NOTE — Progress Notes (Signed)
NEUROLOGY FOLLOW UP OFFICE NOTE  ZAIDAN KEEBLE [id number]  HISTORY OF PRESENT ILLNESS: I had the pleasure of seeing Irl Bodie in follow-up in the neurology clinic on 12/11/2014.  The patient was last seen 3 months ago for neuropathy and increased headaches. Records were personally reviewed where available. MRI brain without contrast was unremarkable with few small foci of T2 hyperintensity in the subcortical white matter of both hemispheres. MRI C-spine without contrast showed cervical spondylosis and facet arthrosis, most notable for moderate bilateral foraminal stenosis at C3-4 and C4-5; moderate right neural foramen stenosis at C5-6, spinal canal narrowing from C3-4 to C5-6. He had 2 EMG/NCVs done, in 06/2013 and 01/2014, which showed  moderate multilevel cervical radiculopathy acute on chronic, L>R; moderate left>right bilateral median mononeuropathy at the wrists, consistent with bialteral carpal tunnel syndrome. Multilevel lumbosacral radiculopathy on the right.   Since his last visit, he has been doing well. No further headaches. He denies any further pain in his chest, no daily leg paresthesias as before. He does have very brief occasional tingling in both feet. No falls, focal weakness, bowel/bladder dysfunction.   HPI: This is a very pleasant 53 yo RH man with a history of GERD, who presented last September 2015 for numbness and headaches. He reports that around 5-6 years ago, he started having pain on the left side of his chest, having the seatbelt on or lying on his left side was uncomfortable. He had a cardiac evaluation which was normal per patient. He was beeing seen at [facility] where MRI brain and spine were unremarkable except for "something close to the vertebra." He received steroid injections in the neck, which did help with the left-sided symptoms. At some point, he started having intermittent daily paresthesias in both legs, and received more injections. He  noticed that hot water and heat seemed to exacerbate his symptoms. He started having the paresthesias in his arms as well, and started Lyrica which did not seem to work. He was switched to Gabapentin but "did not like it." He stopped all medications and started feeling better, with no further paresthesias or pain.   Last 07/28/2014, he was driving to a meeting when he started having a "tremendous headache" over the left peri-orbital and frontal region, radiating to the right occipital region. Pain was 10/10 with nausea, photophobia. He went to the ER, had a head CT which I personally reviewed which was normal. He was given a migraine cocktail and felt better. Lumbar puncture was discussed to r/o subarachnoid hemorrhage, which he declined. He had a headache for 7-10 days after and took Naproxen twice a day. Headaches completely resolved after 2 weeks. He denies any other neurological symptoms since then. He denies any vision changes/visual obscurations, focal numbness/tingling/weakness, tinnitus, ear fullness. He has a history of migraines 20 years ago, different from the recent headaches.   He reports a history of 2 seizures, one 28 years ago while in a restaurant he started having deja vu, went to the bathroom, then regained consciousness in the car to the hospital. No clear reported convulsive activity. The second seizure occurred 15 years ago, he again recalls having deja vu then no other recollection of the seizure. He reports having an MRI at at that time which was normal. He denies any further deja vu episodes. He denies any gaps in time, staring/unresponsive episodes, myoclonic jerks, olfactory/gustatory hallucinations. He had a normal birth and early development, no history of CNS infections, febrile convulsions, significant traumatic brain  injury, family history of seizures  PAST MEDICAL HISTORY: Past Medical History  Diagnosis Date  . Chest discomfort   . High cholesterol   . GERD  (gastroesophageal reflux disease)     MEDICATIONS: Current Outpatient Prescriptions on File Prior to Visit  Medication Sig Dispense Refill  . omeprazole-sodium bicarbonate (ZEGERID) 40-1100 MG per capsule Take 1 capsule by mouth daily before breakfast.     No current facility-administered medications on file prior to visit.    ALLERGIES: No Known Allergies  FAMILY HISTORY: Family History  Problem Relation Age of Onset  . Multiple sclerosis Brother     SOCIAL HISTORY: History   Social History  . Marital Status: Married    Spouse Name: N/A    Number of Children: N/A  . Years of Education: N/A   Occupational History  . Not on file.   Social History Main Topics  . Smoking status: Never Smoker   . Smokeless tobacco: Not on file  . Alcohol Use: Yes     Comment: OCCASIONAL  . Drug Use: No  . Sexual Activity: Yes   Other Topics Concern  . Not on file   Social History Narrative    REVIEW OF SYSTEMS: Constitutional: No fevers, chills, or sweats, no generalized fatigue, change in appetite Eyes: No visual changes, double vision, eye pain Ear, nose and throat: No hearing loss, ear pain, nasal congestion, sore throat Cardiovascular: No chest pain, palpitations Respiratory:  No shortness of breath at rest or with exertion, wheezes GastrointestinaI: No nausea, vomiting, diarrhea, abdominal pain, fecal incontinence Genitourinary:  No dysuria, urinary retention or frequency Musculoskeletal:  No neck pain, +back pain Integumentary: No rash, pruritus, skin lesions Neurological: as above Psychiatric: No depression, insomnia, anxiety Endocrine: No palpitations, fatigue, diaphoresis, mood swings, change in appetite, change in weight, increased thirst Hematologic/Lymphatic:  No anemia, purpura, petechiae. Allergic/Immunologic: no itchy/runny eyes, nasal congestion, recent allergic reactions, rashes  PHYSICAL EXAM: Filed Vitals:   12/11/14 0746  BP: 100/70  Pulse: 77    General: No acute distress Head:  Normocephalic/atraumatic Neck: supple, no paraspinal tenderness, full range of motion Heart:  Regular rate and rhythm Lungs:  Clear to auscultation bilaterally Back: No paraspinal tenderness Skin/Extremities: No rash, no edema Neurological Exam: alert and oriented to person, place, and time. No aphasia or dysarthria. Fund of knowledge is appropriate.  Recent and remote memory are intact.  Attention and concentration are normal.    Able to name objects and repeat phrases. Cranial nerves: Pupils equal, round, reactive to light.  Fundoscopic exam unremarkable, no papilledema. Extraocular movements intact with no nystagmus. Visual fields full. Facial sensation intact. No facial asymmetry. Tongue, uvula, palate midline.  Motor: Bulk and tone normal, muscle strength 5/5 throughout with no pronator drift.  Sensation to light touch intact.  No extinction to double simultaneous stimulation.  Deep tendon reflexes 2+ throughout, toes downgoing.  Finger to nose testing intact.  Gait narrow-based and steady, able to tandem walk adequately.  Romberg negative.  IMPRESSION: This is a pleasant 53 yo RH man with a history of GERD, who presented with several neurological symptoms, including headache, paresthesias, and left-sided chest pain. These have all resolved. Neurological exam normal. MRI brain unremarkable, MRI C-spine shows degenerative changes with foraminal stenosis. EMG/NCV studies in the past have shown cervical and lumbosacral radiculopathy. He is asymptomatic from this at this time. Continue to monitor symptoms. He will be seeing his PCP for annual wellness, wondering about diabetes. He has been seizure-free for 15 years,  no anti-epileptic medications. He knows to call our office for any change in symptoms and will follow-up in 1 year.  Thank you for allowing me to participate in his care.  Please do not hesitate to call for any questions or concerns.  The duration of  this appointment visit was 15 minutes of face-to-face time with the patient.  Greater than 50% of this time was spent in counseling, explanation of diagnosis, planning of further management, and coordination of care.   Ellouise Newer, M.D.   CC: Dr. Redmond School

## 2014-12-11 NOTE — Patient Instructions (Signed)
1. Call our office for any change in symptoms 2. Follow-up in 1 year

## 2014-12-29 ENCOUNTER — Ambulatory Visit (INDEPENDENT_AMBULATORY_CARE_PROVIDER_SITE_OTHER): Payer: BLUE CROSS/BLUE SHIELD | Admitting: Family Medicine

## 2014-12-29 ENCOUNTER — Encounter: Payer: Self-pay | Admitting: Family Medicine

## 2014-12-29 VITALS — BP 130/88 | HR 85 | Wt 194.0 lb

## 2014-12-29 DIAGNOSIS — K219 Gastro-esophageal reflux disease without esophagitis: Secondary | ICD-10-CM

## 2014-12-29 MED ORDER — DEXLANSOPRAZOLE 60 MG PO CPDR: 60.0000 mg | DELAYED_RELEASE_CAPSULE | Freq: Every day | ORAL | Status: DC

## 2014-12-29 NOTE — Patient Instructions (Signed)
Take the Dexilant at night and in the morning take 1 Zantac. If you have trouble in the middle of the night then try Maalox or Mylanta liquid. Stay away from chocolate and licorice. Don't big meals late at night. Call me when you use the Dexilant

## 2014-12-29 NOTE — Progress Notes (Signed)
   Subjective:    Patient ID: Mitchell Ruiz, male    DOB: 1961-02-20, 54 y.o.   MRN: [id number]  HPI He has a history of reflux disease and has been on Zegerid for long period of time. Recently he has noted increased difficulty with burning and mid as well as right lateral chest discomfort. He has obtained relief occasionally with the use of Pepto-Bismol. On occasion he has tried Zantac but see no benefit. He cannot relate this to any particular foods. The chest discomfort is not activity related. He's had no nausea, vomiting or diarrhea.   Review of Systems     Objective:   Physical Exam alert and in no distress. Tympanic membranes and canals are normal. Throat is clear. Tonsils are normal. Neck is supple without adenopathy or thyromegaly. Cardiac exam shows a regular sinus rhythm without murmurs or gallops. Lungs are clear to auscultation. Abdominal exam shows no masses or tenderness with normal bowel sounds       Assessment & Plan:  Gastroesophageal reflux disease without esophagitis - Plan: dexlansoprazole (DEXILANT) 60 MG capsule  he is to use the Dexilant at night and Zantac in the morning for the next 10 days and call me. If continued difficulty, GI referral will probably be made. Also recommend that he avoid foods that he knows difficulty and specifically mentioned chocolate and licorice.

## 2015-01-19 ENCOUNTER — Ambulatory Visit (INDEPENDENT_AMBULATORY_CARE_PROVIDER_SITE_OTHER): Payer: BLUE CROSS/BLUE SHIELD | Admitting: Family Medicine

## 2015-01-19 ENCOUNTER — Encounter: Payer: Self-pay | Admitting: Family Medicine

## 2015-01-19 VITALS — BP 120/80 | HR 79 | Ht 70.0 in | Wt 190.0 lb

## 2015-01-19 DIAGNOSIS — K219 Gastro-esophageal reflux disease without esophagitis: Secondary | ICD-10-CM

## 2015-01-19 DIAGNOSIS — Z23 Encounter for immunization: Secondary | ICD-10-CM

## 2015-01-19 DIAGNOSIS — Z1211 Encounter for screening for malignant neoplasm of colon: Secondary | ICD-10-CM

## 2015-01-19 DIAGNOSIS — M7581 Other shoulder lesions, right shoulder: Secondary | ICD-10-CM

## 2015-01-19 DIAGNOSIS — Z0000 Encounter for general adult medical examination without abnormal findings: Secondary | ICD-10-CM

## 2015-01-19 DIAGNOSIS — R6882 Decreased libido: Secondary | ICD-10-CM

## 2015-01-19 DIAGNOSIS — Z566 Other physical and mental strain related to work: Secondary | ICD-10-CM

## 2015-01-19 LAB — CBC WITH DIFFERENTIAL/PLATELET
BASOS ABS: 0 10*3/uL (ref 0.0–0.1)
Basophils Relative: 0 % (ref 0–1)
EOS ABS: 0.1 10*3/uL (ref 0.0–0.7)
EOS PCT: 1 % (ref 0–5)
HEMATOCRIT: 42.9 % (ref 39.0–52.0)
HEMOGLOBIN: 14.2 g/dL (ref 13.0–17.0)
LYMPHS ABS: 1.4 10*3/uL (ref 0.7–4.0)
LYMPHS PCT: 24 % (ref 12–46)
MCH: 28.5 pg (ref 26.0–34.0)
MCHC: 33.1 g/dL (ref 30.0–36.0)
MCV: 86.1 fL (ref 78.0–100.0)
MONO ABS: 0.4 10*3/uL (ref 0.1–1.0)
MONOS PCT: 7 % (ref 3–12)
MPV: 10.1 fL (ref 8.6–12.4)
NEUTROS PCT: 68 % (ref 43–77)
Neutro Abs: 4 10*3/uL (ref 1.7–7.7)
Platelets: 307 10*3/uL (ref 150–400)
RBC: 4.98 MIL/uL (ref 4.22–5.81)
RDW: 13.6 % (ref 11.5–15.5)
WBC: 5.9 10*3/uL (ref 4.0–10.5)

## 2015-01-19 LAB — COMPREHENSIVE METABOLIC PANEL
ALK PHOS: 45 U/L (ref 39–117)
ALT: 18 U/L (ref 0–53)
AST: 17 U/L (ref 0–37)
Albumin: 4.8 g/dL (ref 3.5–5.2)
BILIRUBIN TOTAL: 0.7 mg/dL (ref 0.2–1.2)
BUN: 22 mg/dL (ref 6–23)
CALCIUM: 10 mg/dL (ref 8.4–10.5)
CHLORIDE: 101 meq/L (ref 96–112)
CO2: 27 meq/L (ref 19–32)
Creat: 0.91 mg/dL (ref 0.50–1.35)
Glucose, Bld: 89 mg/dL (ref 70–99)
POTASSIUM: 5.1 meq/L (ref 3.5–5.3)
Sodium: 138 mEq/L (ref 135–145)
TOTAL PROTEIN: 7.7 g/dL (ref 6.0–8.3)

## 2015-01-19 LAB — LIPID PANEL
CHOL/HDL RATIO: 5.3 ratio
Cholesterol: 266 mg/dL — ABNORMAL HIGH (ref 0–200)
HDL: 50 mg/dL (ref >39)
LDL CALC: 187 mg/dL — AB (ref 0–99)
Triglycerides: 143 mg/dL (ref <150)
VLDL: 29 mg/dL (ref 0–40)

## 2015-01-19 LAB — TESTOSTERONE: Testosterone: 196 ng/dL — ABNORMAL LOW (ref 300–890)

## 2015-01-19 NOTE — Progress Notes (Signed)
Subjective:    Patient ID: Mitchell Ruiz, male    DOB: Jun 19, 1961, 54 y.o.   MRN: [id number]  HPI He is here for complete examination. He continues have difficulty with discomfort with swallowing. He has been on Zegerid and then switch to DEXA lot and has also been using Zantac. He states that every time he eats he has a burning sensation in the mid chest area. This does have him quite stressed. He also has underlying work-related stress due to, he is working for being bought by another company. He does complain of decreased libido as well as fatigue. He does express concerns over his lack of function but is able to achieve and maintain an erection but apparently not as strong is in his younger days. He also complains of right shoulder discomfort started after he did a fair amount of stretching prior to throwing a baseball. He does state that his left knee is doing better. He has no other concerns or complaints. He was scheduled for colonoscopy however did not get it done. Family and social history was reviewed. His marriage is going quite well.   Review of Systems  All other systems reviewed and are negative.      Objective:   Physical Exam BP 120/80 mmHg  Pulse 79  Ht 5\' 10"  (1.778 m)  Wt 190 lb (86.183 kg)  BMI 27.26 kg/m2  SpO2 99%  General Appearance:    Alert, cooperative, no distress, appears stated age  Head:    Normocephalic, without obvious abnormality, atraumatic  Eyes:    PERRL, conjunctiva/corneas clear, EOM's intact, fundi    benign  Ears:    Normal TM's and external ear canals  Nose:   Nares normal, mucosa normal, no drainage or sinus   tenderness  Throat:   Lips, mucosa, and tongue normal; teeth and gums normal  Neck:   Supple, no lymphadenopathy;  thyroid:  no   enlargement/tenderness/nodules; no carotid   bruit or JVD  Back:    Spine nontender, no curvature, ROM normal, no CVA     tenderness  Lungs:     Clear to auscultation bilaterally without wheezes, rales or      ronchi; respirations unlabored  Chest Wall:    No tenderness or deformity   Heart:    Regular rate and rhythm, S1 and S2 normal, no murmur, rub   or gallop  Breast Exam:    No chest wall tenderness, masses or gynecomastia  Abdomen:     Soft, non-tender, nondistended, normoactive bowel sounds,    no masses, no hepatosplenomegaly        Extremities:   No clubbing, cyanosis or edema. Exam of the right shoulder shows full motion. Drop arm test was negative. Supraspinatus testing was uncomfortable. Abduction and external rotation also causes some discomfort. Neer's and Hawkins test was uncomfortable.   Pulses:   2+ and symmetric all extremities  Skin:   Skin color, texture, turgor normal, no rashes or lesions  Lymph nodes:   Cervical, supraclavicular, and axillary nodes normal  Neurologic:   CNII-XII intact, normal strength, sensation and gait; reflexes 2+ and symmetric throughout          Psych:   Normal mood, affect, hygiene and grooming.          Assessment & Plan:  Routine general medical examination at a health care facility - Plan: CBC with Differential/Platelet, Comprehensive metabolic panel, Lipid panel  Gastroesophageal reflux disease without esophagitis - Plan: Ambulatory referral to  Gastroenterology  Need for prophylactic vaccination and inoculation against influenza - Plan: Flu Vaccine QUAD 36+ mos PF IM (Fluarix Quad PF)  Low libido - Plan: Testosterone  Special screening for malignant neoplasms, colon - Plan: Ambulatory referral to Gastroenterology  Rotator cuff tendinitis, right  Work-related stress  I will set him up for GI evaluation for his continued difficulty with reflux symptoms and to also have his colonoscopy. Discussed the work stress. Strongly encouraged him to look at all options concerning this including making sure his resume is up-to-date. Did not recommend one issue over another. Recommend supportive care for the rotator cuff including heat,  anti-inflammatory and relative rest. If continued difficulty he will return here for further evaluation and possible injection.

## 2015-01-19 NOTE — Patient Instructions (Signed)
2 Aleve twice a day for the next 2 weeks. Heat for 20 minutes 2 or 3 times per day. If no improvement after 2 weeks come on back

## 2015-01-20 ENCOUNTER — Other Ambulatory Visit: Payer: Self-pay

## 2015-01-20 DIAGNOSIS — R7989 Other specified abnormal findings of blood chemistry: Secondary | ICD-10-CM

## 2015-01-21 ENCOUNTER — Other Ambulatory Visit: Payer: BLUE CROSS/BLUE SHIELD

## 2015-01-21 DIAGNOSIS — R7989 Other specified abnormal findings of blood chemistry: Secondary | ICD-10-CM

## 2015-01-21 LAB — TESTOSTERONE: TESTOSTERONE: 298 ng/dL — AB (ref 300–890)

## 2015-01-22 ENCOUNTER — Encounter: Payer: Self-pay | Admitting: Physician Assistant

## 2015-01-28 ENCOUNTER — Ambulatory Visit (INDEPENDENT_AMBULATORY_CARE_PROVIDER_SITE_OTHER): Payer: BLUE CROSS/BLUE SHIELD | Admitting: Physician Assistant

## 2015-01-28 ENCOUNTER — Encounter: Payer: Self-pay | Admitting: Physician Assistant

## 2015-01-28 ENCOUNTER — Ambulatory Visit (INDEPENDENT_AMBULATORY_CARE_PROVIDER_SITE_OTHER): Payer: BLUE CROSS/BLUE SHIELD | Admitting: Family Medicine

## 2015-01-28 VITALS — BP 124/80 | HR 76 | Ht 69.5 in | Wt 192.4 lb

## 2015-01-28 DIAGNOSIS — K219 Gastro-esophageal reflux disease without esophagitis: Secondary | ICD-10-CM

## 2015-01-28 DIAGNOSIS — R12 Heartburn: Secondary | ICD-10-CM

## 2015-01-28 DIAGNOSIS — Z1211 Encounter for screening for malignant neoplasm of colon: Secondary | ICD-10-CM

## 2015-01-28 DIAGNOSIS — E291 Testicular hypofunction: Secondary | ICD-10-CM

## 2015-01-28 MED ORDER — TESTOSTERONE 50 MG/5GM (1%) TD GEL: 5.0000 g | Freq: Every day | TRANSDERMAL | Status: DC

## 2015-01-28 MED ORDER — MOVIPREP 100 G PO SOLR: ORAL | Status: DC

## 2015-01-28 MED ORDER — PANTOPRAZOLE SODIUM 40 MG PO TBEC: 40.0000 mg | DELAYED_RELEASE_TABLET | Freq: Every day | ORAL | Status: DC

## 2015-01-28 NOTE — Progress Notes (Signed)
   Subjective:    Patient ID: Mitchell Ruiz, male    DOB: 1961/04/07, 54 y.o.   MRN: [id number]  HPI He is here for consult concerning recent blood work which did show 2 testosterone levels below 300 threshold. He has had difficulty with energy, stamina, strength and libido.   Review of Systems     Objective:   Physical Exam Alert and in no distress otherwise not examined       Assessment & Plan:  Hypogonadism in male - Plan: testosterone (ANDROGEL) 50 MG/5GM (1%) GEL  I discussed the diagnosis since he has 2 readings below 300. He would like to get started. I am not sure what his insurance will let me get him but will provide either Testim or AndroGel whichever is covered. Discussed possible side effects and how to use this medication. He is to return here in one month for follow-up.

## 2015-01-28 NOTE — Progress Notes (Signed)
Agree with initial assessment and plans 

## 2015-01-28 NOTE — Progress Notes (Signed)
Patient ID: Mitchell Ruiz, male   DOB: 11-25-1961, 54 y.o.   MRN: [id number]   Subjective:    Patient ID: Mitchell Ruiz, male    DOB: 04/27/1961, 54 y.o.   MRN: [id number]  HPI Mitchell Ruiz is a pleasant 54 year old white male new to GI today, referred by Dr. Redmond School for colon screening and also GERD. Patient has not had prior colonoscopy. His family history is negative for colon cancer and polyps. He has no complaint of abdominal pain and change in bowel habits, diarrhea, constipation melena or hematochezia. He has had heartburn symptoms over the past several years. He had taken over-the-counter medications initially and says he has also been on Zegerid and Prilosec in the past. Generally he does not get any sour brash type symptoms but over the past several months says he has had more regular heartburn. He says occasionally this wakes him up at night and he also gets heartburn off and on during the day. Sometimes he gets a "knot" or fullness type feeling in his esophagus. No dysphagia or odynophagia. Says he has had some recent gagging "" which she says is not dry heaves. He takes 1 or 2 Aleve on a frequent basis. He is a nonsmoker. Says he has been trying to avoid chocolates caffeine etc. recently. He had been given Zantac 150 mg by mouth daily but says this is not controlling his symptoms.  Review of Systems Pertinent positive and negative review of systems were noted in the above HPI section.  All other review of systems was otherwise negative.  Outpatient Encounter Prescriptions as of 01/28/2015  Medication Sig  . Naproxen Sodium 220 MG CAPS Take by mouth as needed.  . ranitidine (ZANTAC) 150 MG capsule Take 150 mg by mouth daily.  Marland Kitchen MOVIPREP 100 G SOLR Take colonoscopy  prep as directed.  . pantoprazole (PROTONIX) 40 MG tablet Take 1 tablet (40 mg total) by mouth daily.  . [DISCONTINUED] dexlansoprazole (DEXILANT) 60 MG capsule Take 1 capsule (60 mg total) by mouth daily.   No Known  Allergies Patient Active Problem List   Diagnosis Date Noted  . GERD (gastroesophageal reflux disease) 01/28/2015  . Episodic tension-type headache, not intractable 12/11/2014  . Headache(784.0) 09/11/2014  . Numbness 09/11/2014  . Contact dermatitis 11/29/2011   History   Social History  . Marital Status: Married    Spouse Name: N/A    Number of Children: 2  . Years of Education: N/A   Occupational History  . banker    Social History Main Topics  . Smoking status: Never Smoker   . Smokeless tobacco: Never Used  . Alcohol Use: 4.2 oz/week    7 Not specified per week     Comment: OCCASIONAL  . Drug Use: No  . Sexual Activity: Yes   Other Topics Concern  . Not on file   Social History Narrative    Mitchell Ruiz's family history includes Alcoholism in his maternal grandfather; Diabetes in his maternal grandfather; Heart failure in his maternal grandmother; Multiple sclerosis in his brother.      Objective:    Filed Vitals:   01/28/15 0923  BP: 124/80  Pulse: 76    Physical Exam   well-developed white male in no acute distress, pleasant blood pressure 124/80 pulse 76 height 5 foot 9 weight 192. HEENT; nontraumatic normocephalic EOMI PERRLA sclera anicteric, Supple; no JVD, Cardiovascular; regular rate and rhythm with S1-S2 no murmur or gallop, Pulmonary; clear bilaterally, Abdomen; soft nontender nondistended bowel sounds  are active there is no palpable mass or hepatosplenomegaly, Rectal; exam not done, Extremities; no clubbing cyanosis or edema skin warm and dry, Psych ;mood and affect appropriate      Assessment & Plan:   #1 54 yo male referred  For  colon neoplasia surveillance-asymptomatic, and average risk #2 Chronic GERD/heartburn- r/o Barretts  Plan; Anti-reflux regimen discusssed with pt, and he was given educational materials Stop Zantac Start Protonix 40 mg po Qam Schedule for Colonoscopy and EGD with Dr. Henrene Pastor- procedures discussed in detail with patient  and he is agreeable to proceed.   Moksha Dorgan S Tranice Laduke PA-C 01/28/2015

## 2015-01-28 NOTE — Patient Instructions (Signed)
You have been scheduled for an endoscopy and colonoscopy. Please follow the written instructions given to you at your visit today. Please pick up your prep at the pharmacy within the next 1-3 days. [facility] [address] If you use inhalers (even only as needed), please bring them with you on the day of your procedure. Your physician has requested that you go to www.startemmi.com and enter the access code given to you at your visit today. This web site gives a general overview about your procedure. However, you should still follow specific instructions given to you by our office regarding your preparation for the procedure.   You may have a light breakfast the morning of prep day (the day before the procedure). You may choose from: eggs, toast, chicken noodle soup, crackers.  You should have your breakfast completed between 8:00 and 9:00 am.  Clear liquids only for the rest of the day on prep day and up until 3 hours before procedure.

## 2015-02-08 ENCOUNTER — Telehealth: Payer: Self-pay | Admitting: Family Medicine

## 2015-02-16 ENCOUNTER — Encounter: Payer: Self-pay | Admitting: Internal Medicine

## 2015-02-16 ENCOUNTER — Ambulatory Visit (AMBULATORY_SURGERY_CENTER): Payer: BLUE CROSS/BLUE SHIELD | Admitting: Internal Medicine

## 2015-02-16 VITALS — BP 121/63 | HR 82 | Temp 96.8°F | Resp 21 | Ht 69.0 in | Wt 192.0 lb

## 2015-02-16 DIAGNOSIS — Z1211 Encounter for screening for malignant neoplasm of colon: Secondary | ICD-10-CM

## 2015-02-16 DIAGNOSIS — D122 Benign neoplasm of ascending colon: Secondary | ICD-10-CM

## 2015-02-16 DIAGNOSIS — D125 Benign neoplasm of sigmoid colon: Secondary | ICD-10-CM

## 2015-02-16 DIAGNOSIS — K219 Gastro-esophageal reflux disease without esophagitis: Secondary | ICD-10-CM

## 2015-02-16 HISTORY — PX: UPPER GASTROINTESTINAL ENDOSCOPY: SHX188

## 2015-02-16 HISTORY — PX: COLONOSCOPY: SHX174

## 2015-02-16 MED ORDER — SODIUM CHLORIDE 0.9 % IV SOLN: 500.0000 mL | INTRAVENOUS | Status: DC

## 2015-02-16 NOTE — Op Note (Signed)
[place]  [address] [place], [address]   COLONOSCOPY PROCEDURE REPORT  PATIENT: Mitchell Ruiz, Mitchell Ruiz  MR#: [id number] BIRTHDATE: 03-Aug-1961 , 25  yrs. old GENDER: male ENDOSCOPIST: Eustace Quail, MD REFERRED SA:YTKZ Redmond School, M.D. PROCEDURE DATE:  02/16/2015 PROCEDURE:   Colonoscopy with biopsy x 1 and Colonoscopy with snare polypectomy x 1 First Screening Colonoscopy - Avg.  risk and is 50 yrs.  old or older Yes.  Prior Negative Screening - Now for repeat screening. N/A  History of Adenoma - Now for follow-up colonoscopy & has been > or = to 3 yrs.  N/A  Polyps Removed Today? Yes. ASA CLASS:   Class I INDICATIONS:average risk patient for colorectal cancer. MEDICATIONS: Monitored anesthesia care and Propofol 500 mg IV  DESCRIPTION OF PROCEDURE:   After the risks benefits and alternatives of the procedure were thoroughly explained, informed consent was obtained.  The digital rectal exam revealed no abnormalities of the rectum.   The LB [id number]  endoscope was introduced through the anus and advanced to the cecum, which was identified by both the appendix and ileocecal valve. No adverse events experienced.   The quality of the prep was excellent, using MoviPrep  The instrument was then slowly withdrawn as the colon was fully examined.  COLON FINDINGS: The examined terminal ileum appeared to be normal. A single polyp measuring 1 mm in size was found in the ascending colon.  A biopsy was performed using cold forceps, to remove the polyp and submitted for pathologic analysis.   A second polyp measuring 6 mm in size was found in the sigmoid colon.  A polypectomy was performed with a cold snare.  The resection was complete, the polyp tissue was completely retrieved and sent to histology.   The examination was otherwise normal.  Retroflexed views revealed internal hemorrhoids. The time to cecum=2 minutes 26 seconds.  Withdrawal time=17 minutes 33 seconds.   The scope was withdrawn and the procedure completed. COMPLICATIONS: There were no immediate complications.  ENDOSCOPIC IMPRESSION: 1.   The examined terminal ileum appeared normal 2.   Single polyp tiny was found in the ascending colon; biopsy removal was performed using cold forceps 3.   Single polyp measuring 6 mm in size was found in the sigmoid colon; polypectomy was performed with a cold snare 4.   The examination was otherwise normal  RECOMMENDATIONS: 1.  Repeat colonoscopy in 5 years if polyp(s) adenomatous; otherwise 10 years 2.  Upper endoscopy today (please see report)  eSigned:  Eustace Quail, MD 02/16/2015 3:09 PM   cc: Jill Alexanders, MD and The Patient

## 2015-02-16 NOTE — Op Note (Signed)
[place]  [address] [place], [address]   ENDOSCOPY PROCEDURE REPORT  PATIENT: Mitchell Ruiz, Mitchell Ruiz  MR#: [id number] BIRTHDATE: 06/17/1961 , 37  yrs. old GENDER: male ENDOSCOPIST: Eustace Quail, MD REFERRED BY:  Jill Alexanders, M.D. PROCEDURE DATE:  02/16/2015 PROCEDURE:  EGD, diagnostic ASA CLASS:     Class I INDICATIONS:  history of chronic esophageal reflux. MEDICATIONS: Monitored anesthesia care and Propofol 100 mg IV TOPICAL ANESTHETIC: none  DESCRIPTION OF PROCEDURE: After the risks benefits and alternatives of the procedure were thoroughly explained, informed consent was obtained.  The LB [id number] endoscope was introduced through the mouth and advanced to the second portion of the duodenum , Without limitations.  The instrument was slowly withdrawn as the mucosa was fully examined.    EXAM: All examined portions on upper endoscopy were normal.The esophagus and gastroesophageal junction were completely normal in appearance.  The stomach was entered and closely examined.The antrum, angularis, and lesser curvature were well visualized, including a retroflexed view of the cardia and fundus.  The stomach wall was normally distensable.  The scope passed easily through the pylorus into the duodenum.  Retroflexed views revealed a hiatal hernia.     The scope was then withdrawn from the patient and the procedure completed.  COMPLICATIONS: There were no immediate complications.  ENDOSCOPIC IMPRESSION: 1. Normal EGD 2. GERD  RECOMMENDATIONS: 1.  Anti-reflux regimen to be followed 2.  Continue recently prescribed pantoprazole 3. Routine office follow-up with Dr. Henrene Pastor in 1 year.  REPEAT EXAM:  eSigned:  Eustace Quail, MD 02/16/2015 3:12 PM    UP:JSRP Redmond School, MD and The Patient

## 2015-02-16 NOTE — Progress Notes (Signed)
Called to room to assist during endoscopic procedure.  Patient ID and intended procedure confirmed with present staff. Received instructions for my participation in the procedure from the performing physician.  

## 2015-02-16 NOTE — Patient Instructions (Signed)
Discharge instructions given. Handout on polyps. Resume previous medications. YOU HAD AN ENDOSCOPIC PROCEDURE TODAY AT [facility]: Refer to the procedure report that was given to you for any specific questions about what was found during the examination.  If the procedure report does not answer your questions, please call your gastroenterologist to clarify.  If you requested that your care partner not be given the details of your procedure findings, then the procedure report has been included in a sealed envelope for you to review at your convenience later.  YOU SHOULD EXPECT: Some feelings of bloating in the abdomen. Passage of more gas than usual.  Walking can help get rid of the air that was put into your GI tract during the procedure and reduce the bloating. If you had a lower endoscopy (such as a colonoscopy or flexible sigmoidoscopy) you may notice spotting of blood in your stool or on the toilet paper. If you underwent a bowel prep for your procedure, then you may not have a normal bowel movement for a few days.  DIET: Your first meal following the procedure should be a light meal and then it is ok to progress to your normal diet.  A half-sandwich or bowl of soup is an example of a good first meal.  Heavy or fried foods are harder to digest and may make you feel nauseous or bloated.  Likewise meals heavy in dairy and vegetables can cause extra gas to form and this can also increase the bloating.  Drink plenty of fluids but you should avoid alcoholic beverages for 24 hours.  ACTIVITY: Your care partner should take you home directly after the procedure.  You should plan to take it easy, moving slowly for the rest of the day.  You can resume normal activity the day after the procedure however you should NOT DRIVE or use heavy machinery for 24 hours (because of the sedation medicines used during the test).    SYMPTOMS TO REPORT IMMEDIATELY: A gastroenterologist can be reached at any  hour.  During normal business hours, 8:30 AM to 5:00 PM Monday through Friday, call [phone].  After hours and on weekends, please call the GI answering service at [phone] who will take a message and have the physician on call contact you.   Following lower endoscopy (colonoscopy or flexible sigmoidoscopy):  Excessive amounts of blood in the stool  Significant tenderness or worsening of abdominal pains  Swelling of the abdomen that is new, acute  Fever of 100F or higher  Following upper endoscopy (EGD)  Vomiting of blood or coffee ground material  New chest pain or pain under the shoulder blades  Painful or persistently difficult swallowing  New shortness of breath  Fever of 100F or higher  Black, tarry-looking stools  FOLLOW UP: If any biopsies were taken you will be contacted by phone or by letter within the next 1-3 weeks.  Call your gastroenterologist if you have not heard about the biopsies in 3 weeks.  Our staff will call the home number listed on your records the next business day following your procedure to check on you and address any questions or concerns that you may have at that time regarding the information given to you following your procedure. This is a courtesy call and so if there is no answer at the home number and we have not heard from you through the emergency physician on call, we will assume that you have returned to your regular daily  activities without incident.  SIGNATURES/CONFIDENTIALITY: You and/or your care partner have signed paperwork which will be entered into your electronic medical record.  These signatures attest to the fact that that the information above on your After Visit Summary has been reviewed and is understood.  Full responsibility of the confidentiality of this discharge information lies with you and/or your care-partner.

## 2015-02-16 NOTE — Progress Notes (Signed)
Patient awakening,vss,report to rn 

## 2015-02-17 ENCOUNTER — Telehealth: Payer: Self-pay

## 2015-02-17 NOTE — Telephone Encounter (Signed)
Left message on answering machine. 

## 2015-02-23 ENCOUNTER — Encounter: Payer: Self-pay | Admitting: Internal Medicine

## 2015-02-23 NOTE — Telephone Encounter (Signed)
[place].  Was approved over phone for 1 year, P.A. # [id number], left message for pt.  Called pharmacy and went thru insurance but pt has high deductible and out of pocket cost is $338

## 2015-02-24 ENCOUNTER — Telehealth: Payer: Self-pay | Admitting: Family Medicine

## 2015-02-24 NOTE — Telephone Encounter (Signed)
lm

## 2015-02-26 ENCOUNTER — Ambulatory Visit: Payer: BLUE CROSS/BLUE SHIELD | Admitting: Family Medicine

## 2015-03-05 ENCOUNTER — Ambulatory Visit (INDEPENDENT_AMBULATORY_CARE_PROVIDER_SITE_OTHER): Payer: BLUE CROSS/BLUE SHIELD | Admitting: Medical

## 2015-03-05 ENCOUNTER — Encounter: Payer: Self-pay | Admitting: Medical

## 2015-03-05 VITALS — BP 120/80 | HR 68 | Temp 98.3°F | Resp 15 | Wt 194.8 lb

## 2015-03-05 DIAGNOSIS — M7521 Bicipital tendinitis, right shoulder: Secondary | ICD-10-CM | POA: Diagnosis not present

## 2015-03-05 DIAGNOSIS — M7581 Other shoulder lesions, right shoulder: Secondary | ICD-10-CM

## 2015-03-05 MED ORDER — NAPROXEN 375 MG PO TABS: 375.0000 mg | ORAL_TABLET | Freq: Two times a day (BID) | ORAL | Status: DC

## 2015-03-05 NOTE — Progress Notes (Signed)
Subjective Here for c/o right shoulder pain.  First noticed pain doing a stretch back in January, flexion and external rotation stretch.  Since then has had pain daily.  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  Mentioned the pain when he saw Dr. Redmond School on a physical, advised to use heating pad, aspirin, give it some time.  Worse at night during sleep.  Doesn't recall a specific injury in the past.   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   Using Aleve occasionally, but no arm sling.  Used ice and heat off an on.  Right handed.  No other aggravating or relieving factors. No other complaint.  ROS as in subjective  Objective: BP 120/80 mmHg  Pulse 68  Temp(Src) 98.3 F (36.8 C) (Oral)  Resp 15  Wt 194 lb 12.8 oz (88.361 kg)  Gen: wd, wn, nad Skin:unremarkable Neck:supple,nontender, no mass, no lymphadenopathy MSK: tender right biceps tendon, pain with slow adduction, pain with empty can test, pain with internal and external ROM, otherwise exam unremarkable, rest of UE exam normal Arms Neurovascularly intact    Assessment:  Encounter Diagnoses  Name Primary?  . Rotator cuff tendinitis, right Yes  . Biceps tendonitis, right      Plan: Discussed symptoms, exam findings, treatment plan.  If not improving in 3-4 wk, consider imaging, recheck.  Rotator cuff and biceps tendonitis  For the next 1-2 weeks...  Use ice to the shoulder  Arm sling for an hour at a time, preferably 1-2 times daily  Begin Naprosyn 1 tablet twice daily for 7-14 days  Avoid using the arm no more than you have too  After 1-2 weeks if feeling improved  Begin the 5 arm exercises with resistance bands we discussed  Including...  shoulder front raise  Side raise  Bent over side raise for rear deltoid  Internal and external rotation exercise

## 2015-03-05 NOTE — Patient Instructions (Signed)
Rotator cuff and biceps tendonitis  For the next 1-2 weeks...  Use ice to the shoulder  Arm sling for an hour at a time, preferably 1-2 times daily  Begin Naprosyn 1 tablet twice daily for 7-14 days  Avoid using the arm no more than you have too  After 1-2 weeks if feeling improved  Begin the 5 arm exercises with resistance bands we discussed  Including...  shoulder front raise  Side raise  Bent over side raise for rear deltoid  Internal and external rotation exercise   Rotator Cuff Tendinitis  Rotator cuff tendinitis is inflammation of the tough, cord-like bands that connect muscle to bone (tendons) in your rotator cuff. Your rotator cuff is the collection of all the muscles and tendons that connect your arm to your shoulder. Your rotator cuff holds the head of your upper arm bone (humerus) in the cup (fossa) of your shoulder blade (scapula). CAUSES Rotator cuff tendinitis is usually caused by overusing the joint involved.  SIGNS AND SYMPTOMS  Deep ache in the shoulder also felt on the outside upper arm over the shoulder muscle.  Point tenderness over the area that is injured.  Pain comes on gradually and becomes worse with lifting the arm to the side (abduction) or turning it inward (internal rotation).  May lead to a chronic tear: When a rotator cuff tendon becomes inflamed, it runs the risk of losing its blood supply, causing some tendon fibers to die. This increases the risk that the tendon can fray and partially or completely tear. DIAGNOSIS Rotator cuff tendinitis is diagnosed by taking a medical history, performing a physical exam, and reviewing results of imaging exams. The medical history is useful to help determine the type of rotator cuff injury. The physical exam will include looking at the injured shoulder, feeling the injured area, and watching you do range-of-motion exercises. X-ray exams are typically done to rule out other causes of shoulder pain, such as  fractures. MRI is the imaging exam usually used for significant shoulder injuries. Sometimes a dye study called CT arthrogram is done, but it is not as widely used as MRI. In some institutions, special ultrasound tests may also be used to aid in the diagnosis. TREATMENT  Less Severe Cases  Use of a sling to rest the shoulder for a short period of time. Prolonged use of the sling can cause stiffness, weakness, and loss of motion of the shoulder joint.  Anti-inflammatory medicines, such as ibuprofen or naproxen sodium, may be prescribed. More Severe Cases  Physical therapy.  Use of steroid injections into the shoulder joint.  Surgery. HOME CARE INSTRUCTIONS   Use a sling or splint until the pain decreases. Prolonged use of the sling can cause stiffness, weakness, and loss of motion of the shoulder joint.  Apply ice to the injured area:  Put ice in a plastic bag.  Place a towel between your skin and the bag.  Leave the ice on for 20 minutes, 2-3 times a day.  Try to avoid use other than gentle range of motion while your shoulder is painful. Use the shoulder and exercise only as directed by your health care provider. Stop exercises or range of motion if pain or discomfort increases, unless directed otherwise by your health care provider.  Only take over-the-counter or prescription medicines for pain, discomfort, or fever as directed by your health care provider.  If you were given a shoulder sling and straps (immobilizer), do not remove it except as directed, or  until you see a health care provider for a follow-up exam. If you need to remove it, move your arm as little as possible or as directed.  You may want to sleep on several pillows at night to lessen swelling and pain. SEEK IMMEDIATE MEDICAL CARE IF:   Your shoulder pain increases or new pain develops in your arm, hand, or fingers and is not relieved with medicines.  You have new, unexplained symptoms, especially increased  numbness in the hands or loss of strength.  You develop any worsening of the problems that brought you in for care.  Your arm, hand, or fingers are numb or tingling.  Your arm, hand, or fingers are swollen, painful, or turn white or blue. MAKE SURE YOU:  Understand these instructions.  Will watch your condition.  Will get help right away if you are not doing well or get worse. Document Released: 03/02/2004 Document Revised: 10/01/2013 Document Reviewed: 07/23/2013 [facility] Patient Information 2015 [place], [place]. This information is not intended to replace advice given to you by your health care provider. Make sure you discuss any questions you have with your health care provider.

## 2015-09-23 ENCOUNTER — Telehealth: Payer: Self-pay | Admitting: Family Medicine

## 2015-09-23 MED ORDER — TRAMADOL HCL 50 MG PO TABS: 50.0000 mg | ORAL_TABLET | Freq: Three times a day (TID) | ORAL | Status: DC | PRN

## 2015-09-23 MED ORDER — ONDANSETRON HCL 4 MG PO TABS: 4.0000 mg | ORAL_TABLET | Freq: Three times a day (TID) | ORAL | Status: DC | PRN

## 2015-09-23 NOTE — Addendum Note (Signed)
Addended by: Billie Lade on: 09/23/2015 04:16 PM   Modules accepted: Orders

## 2015-09-23 NOTE — Telephone Encounter (Signed)
He was seen in the Er and treated with codeine and Zofran. I'll call in Tramadol and Zofran

## 2015-09-23 NOTE — Telephone Encounter (Signed)
Phoned in.

## 2015-09-23 NOTE — Telephone Encounter (Signed)
Call in the tramadol

## 2015-09-23 NOTE — Telephone Encounter (Signed)
Wife called & states husband having really bad headache and vomiting like you've treated him before in the past, it's so he can't come in today.  Asked if you would call in the medicine you had called in before for his headaches to [facility].  And let her know at T#[phone] wife Rebekah [place] (she is on HIPPA)

## 2015-09-24 ENCOUNTER — Ambulatory Visit (INDEPENDENT_AMBULATORY_CARE_PROVIDER_SITE_OTHER): Payer: BLUE CROSS/BLUE SHIELD | Admitting: Family Medicine

## 2015-09-24 ENCOUNTER — Encounter: Payer: Self-pay | Admitting: Family Medicine

## 2015-09-24 VITALS — BP 100/60 | HR 84 | Wt 189.0 lb

## 2015-09-24 DIAGNOSIS — G43109 Migraine with aura, not intractable, without status migrainosus: Secondary | ICD-10-CM | POA: Diagnosis not present

## 2015-09-24 MED ORDER — SUMATRIPTAN SUCCINATE 100 MG PO TABS: 100.0000 mg | ORAL_TABLET | ORAL | Status: DC | PRN

## 2015-09-24 NOTE — Progress Notes (Signed)
   Subjective:    Patient ID: Mitchell Ruiz, male    DOB: 1961-03-21, 54 y.o.   MRN: [id number]  HPI He is here for evaluation of a headache. This apparently started yesterday. He did note some visual changes approximately 20 minutes before the headache occurred. It occurred in the right retro-orbital area and was throbbing in nature. He did vomit several times and did have to going dark room. He had a similar episode one year ago and was seen in the emergency room. CT scan at that time was negative. He does remember having a total of a proximally 6 of these his entire life. Yesterday he was given Zofran and tramadol but still complains of some slight pain today.   Review of Systems     Objective:   Physical Exam Alert and in no distress. DTRs normal .Tympanic membranes and canals are normal. Pharyngeal area is normal. Neck is supple without adenopathy or thyromegaly. Cardiac exam shows a regular sinus rhythm without murmurs or gallops. Lungs are clear to auscultation.        Assessment & Plan:  Migraine with aura and without status migrainosus, not intractable - Plan: SUMAtriptan (IMITREX) 100 MG tablet  I explained that this is a classic migraine with the aura. I will give him Imitrex. I explained that if he has the aura again to use 2 Aleve immediately and if the headache occurs, then use Imitrex. Also discussed the potential for this getting worse and possibly needing more preventative medication. At this point that is not necessary. He expressed understanding of all this.

## 2015-09-24 NOTE — Patient Instructions (Signed)
Migraine Headache A migraine headache is an intense, throbbing pain on one or both sides of your head. A migraine can last for 30 minutes to several hours. CAUSES  The exact cause of a migraine headache is not always known. However, a migraine may be caused when nerves in the brain become irritated and release chemicals that cause inflammation. This causes pain. Certain things may also trigger migraines, such as:  Alcohol.  Smoking.  Stress.  Menstruation.  Aged cheeses.  Foods or drinks that contain nitrates, glutamate, aspartame, or tyramine.  Lack of sleep.  Chocolate.  Caffeine.  Hunger.  Physical exertion.  Fatigue.  Medicines used to treat chest pain (nitroglycerine), birth control pills, estrogen, and some blood pressure medicines. SIGNS AND SYMPTOMS  Pain on one or both sides of your head.  Pulsating or throbbing pain.  Severe pain that prevents daily activities.  Pain that is aggravated by any physical activity.  Nausea, vomiting, or both.  Dizziness.  Pain with exposure to bright lights, loud noises, or activity.  General sensitivity to bright lights, loud noises, or smells. Before you get a migraine, you may get warning signs that a migraine is coming (aura). An aura may include:  Seeing flashing lights.  Seeing bright spots, halos, or zigzag lines.  Having tunnel vision or blurred vision.  Having feelings of numbness or tingling.  Having trouble talking.  Having muscle weakness. DIAGNOSIS  A migraine headache is often diagnosed based on:  Symptoms.  Physical exam.  A CT scan or MRI of your head. These imaging tests cannot diagnose migraines, but they can help rule out other causes of headaches. TREATMENT Medicines may be given for pain and nausea. Medicines can also be given to help prevent recurrent migraines.  HOME CARE INSTRUCTIONS  Only take over-the-counter or prescription medicines for pain or discomfort as directed by your  health care provider. The use of long-term narcotics is not recommended.  Lie down in a dark, quiet room when you have a migraine.  Keep a journal to find out what may trigger your migraine headaches. For example, write down:  What you eat and drink.  How much sleep you get.  Any change to your diet or medicines.  Limit alcohol consumption.  Quit smoking if you smoke.  Get 7-9 hours of sleep, or as recommended by your health care provider.  Limit stress.  Keep lights dim if bright lights bother you and make your migraines worse. SEEK IMMEDIATE MEDICAL CARE IF:   Your migraine becomes severe.  You have a fever.  You have a stiff neck.  You have vision loss.  You have muscular weakness or loss of muscle control.  You start losing your balance or have trouble walking.  You feel faint or pass out.  You have severe symptoms that are different from your first symptoms. MAKE SURE YOU:   Understand these instructions.  Will watch your condition.  Will get help right away if you are not doing well or get worse. Document Released: 12/11/2005 Document Revised: 04/27/2014 Document Reviewed: 08/18/2013 [facility] Patient Information 2015 [place], [place]. This information is not intended to replace advice given to you by your health care provider. Make sure you discuss any questions you have with your health care provider. If you have another episode of visual changes then immediately take 2 Aleve. If you get a headache then use the Imitrex

## 2016-01-27 ENCOUNTER — Encounter: Payer: Self-pay | Admitting: Internal Medicine

## 2016-03-14 ENCOUNTER — Other Ambulatory Visit: Payer: Self-pay | Admitting: Physician Assistant

## 2016-03-28 ENCOUNTER — Ambulatory Visit (INDEPENDENT_AMBULATORY_CARE_PROVIDER_SITE_OTHER): Payer: BLUE CROSS/BLUE SHIELD | Admitting: Family Medicine

## 2016-03-28 ENCOUNTER — Encounter: Payer: Self-pay | Admitting: Family Medicine

## 2016-03-28 VITALS — BP 118/78 | HR 64 | Ht 70.5 in | Wt 198.2 lb

## 2016-03-28 DIAGNOSIS — E785 Hyperlipidemia, unspecified: Secondary | ICD-10-CM | POA: Diagnosis not present

## 2016-03-28 DIAGNOSIS — Z87898 Personal history of other specified conditions: Secondary | ICD-10-CM

## 2016-03-28 DIAGNOSIS — Z0000 Encounter for general adult medical examination without abnormal findings: Secondary | ICD-10-CM

## 2016-03-28 DIAGNOSIS — G43109 Migraine with aura, not intractable, without status migrainosus: Secondary | ICD-10-CM | POA: Diagnosis not present

## 2016-03-28 DIAGNOSIS — G44219 Episodic tension-type headache, not intractable: Secondary | ICD-10-CM | POA: Diagnosis not present

## 2016-03-28 DIAGNOSIS — Z1159 Encounter for screening for other viral diseases: Secondary | ICD-10-CM | POA: Diagnosis not present

## 2016-03-28 DIAGNOSIS — K219 Gastro-esophageal reflux disease without esophagitis: Secondary | ICD-10-CM | POA: Diagnosis not present

## 2016-03-28 DIAGNOSIS — Z8669 Personal history of other diseases of the nervous system and sense organs: Secondary | ICD-10-CM | POA: Diagnosis not present

## 2016-03-28 LAB — CBC WITH DIFFERENTIAL/PLATELET
BASOS PCT: 0 %
Basophils Absolute: 0 cells/uL (ref 0–200)
Eosinophils Absolute: 116 cells/uL (ref 15–500)
Eosinophils Relative: 2 %
HCT: 39.7 % (ref 38.5–50.0)
Hemoglobin: 13.7 g/dL (ref 13.2–17.1)
LYMPHS PCT: 31 %
Lymphs Abs: 1798 cells/uL (ref 850–3900)
MCH: 29.6 pg (ref 27.0–33.0)
MCHC: 34.5 g/dL (ref 32.0–36.0)
MCV: 85.7 fL (ref 80.0–100.0)
MPV: 10 fL (ref 7.5–12.5)
Monocytes Absolute: 580 cells/uL (ref 200–950)
Monocytes Relative: 10 %
Neutro Abs: 3306 cells/uL (ref 1500–7800)
Neutrophils Relative %: 57 %
PLATELETS: 237 10*3/uL (ref 140–400)
RBC: 4.63 MIL/uL (ref 4.20–5.80)
RDW: 13.8 % (ref 11.0–15.0)
WBC: 5.8 10*3/uL (ref 4.0–10.5)

## 2016-03-28 LAB — COMPREHENSIVE METABOLIC PANEL
ALK PHOS: 41 U/L (ref 40–115)
ALT: 18 U/L (ref 9–46)
AST: 17 U/L (ref 10–35)
Albumin: 4.6 g/dL (ref 3.6–5.1)
BILIRUBIN TOTAL: 0.8 mg/dL (ref 0.2–1.2)
BUN: 23 mg/dL (ref 7–25)
CALCIUM: 9.2 mg/dL (ref 8.6–10.3)
CO2: 27 mmol/L (ref 20–31)
Chloride: 104 mmol/L (ref 98–110)
Creat: 0.87 mg/dL (ref 0.70–1.33)
Glucose, Bld: 80 mg/dL (ref 65–99)
Potassium: 4.5 mmol/L (ref 3.5–5.3)
Sodium: 139 mmol/L (ref 135–146)
Total Protein: 7.1 g/dL (ref 6.1–8.1)

## 2016-03-28 LAB — POCT URINALYSIS DIPSTICK
BILIRUBIN UA: NEGATIVE
Glucose, UA: NEGATIVE
Ketones, UA: NEGATIVE
Leukocytes, UA: NEGATIVE
NITRITE UA: NEGATIVE
PH UA: 6
Protein, UA: NEGATIVE
RBC UA: NEGATIVE
Spec Grav, UA: >=1.03
UROBILINOGEN UA: NEGATIVE

## 2016-03-28 LAB — LIPID PANEL
Cholesterol: 281 mg/dL — ABNORMAL HIGH (ref 125–200)
HDL: 46 mg/dL (ref >=40)
LDL CALC: 210 mg/dL — AB (ref <130)
TRIGLYCERIDES: 124 mg/dL (ref <150)
Total CHOL/HDL Ratio: 6.1 Ratio — ABNORMAL HIGH (ref <=5.0)
VLDL: 25 mg/dL (ref <30)

## 2016-03-28 MED ORDER — PANTOPRAZOLE SODIUM 40 MG PO TBEC: 40.0000 mg | DELAYED_RELEASE_TABLET | Freq: Every day | ORAL | Status: DC

## 2016-03-28 NOTE — Progress Notes (Signed)
Subjective:     Patient ID: Mitchell Ruiz, male   DOB: 1961/11/14, 55 y.o.   MRN: [id number]  HPI   Mitchell Ruiz is here for complete examination.  He recently had a wellness checkup at his workplace where he was told that he has high cholesterol.  He also has a history of migraine headaches, but has not had an episode in the past year.  He has sumatriptan for acute episodes but does not take any prophylactic treatment.  He had a workup for GERD including endoscopy over the past year and was prescribed pantoprazole which he had taken daily for the past year.  When he ran out of this medication, his symptoms reappeared within about a week and he has started back on his daily dose.  He also has a history of seizures, but his last episode was 15 yrs ago with a normal workup and he does not currently take any seizure medication.  Family and social history as well as health maintenance and immunizations were reviewed and he has not had a Hepatitis C screen. His home life and marriage are going well. He recently changed jobs and is very happy with his new job He has no other concerns or complaints.      Review of Systems All other systems reviewed are negative    Objective:   Physical Exam  Alert and in no distress.  Tympanic membranes and canals are normal. Pharyngeal area is normal. Neck is supple without adenopathy or thyromegaly.  Cardiac exam shows a regular sinus rhythm without murmurs or gallops.  Lungs are clear to auscultation. Normal bowel sounds and no tenderness to palpation.Genital and rectal were deferred      Assessment/Plan:     Routine general medical examination at a health care facility - Plan: POCT urinalysis dipstick, CBC with Differential/Platelet, Comprehensive metabolic panel, Lipid panel  Migraine with aura and without status migrainosus, not intractable  Gastroesophageal reflux disease, esophagitis presence not specified - Plan: pantoprazole (PROTONIX) 40 MG  tablet  Episodic tension-type headache, not intractable  History of seizure  Need for hepatitis C screening test - Plan: Hepatitis C antibody  Hyperlipidemia  Overweight Discussed diet and recommended 20 mins of exercise a day.Recommend he try to cut back on his PPI and use it more or less on an as-needed basis rather than on a regular basis. He will continue to treat his headaches as before. No intervention or further evaluation/workup needed for seizures. Follow-up on lipids pending results of lab work    History and physical exam conducted by Marylen Ponto (Medical Student) in conjunction with Dr. Redmond School.

## 2016-03-28 NOTE — Patient Instructions (Signed)
20 minutes of something physical every day 

## 2016-03-29 LAB — HEPATITIS C ANTIBODY: HCV AB: NEGATIVE

## 2016-07-21 ENCOUNTER — Telehealth: Payer: Self-pay | Admitting: Nurse Practitioner

## 2016-07-21 NOTE — Telephone Encounter (Signed)
Agree with note from Christen Bame, RN Will work him in on Tuesday His Cholesterol levels are very elevated . Will discuss statin therapy.

## 2016-07-21 NOTE — Telephone Encounter (Signed)
Spoke with patient per request from Dr. Acie Fredrickson.  He states he is having pains in his chest intermittently, not constant Complains of pain when laying on left side which causes him to lay on his back.  States on one occasion he had nausea and cold sweats associated with pain.  States he woke up last night and felt that heart was beating faster than his breathing.   States he does not sleep well; wife states he snores He complains of soreness to left chest, states seat belt was uncomfortable during a long car trip earlier this week.   Able to walk long distances during his vacation without difficulty Occasional SOB - states he feels a slight discomfort while talking to me at this time No hx irregular heart in patient, siblings, or parents that he is aware of Felt heart rate was irregular after eating a heavy meal earlier this week Hx GERD - takes protonix daily Describes chest discomfort as feeling like "a tear with air coming through" I advised him on how to check his pulse and to note if it is >120 bpm at any time.  Dr. Acie Fredrickson can see him in the office on Tuesday Aug. 1 at 3:00.  I advised him to proceed to [facility] if symptoms worsen or prior to that appointment.   I advised that he can also call our office number over the weekend to reach the on call provider.  He verbalized understanding and agreement and thanked me for the call.

## 2016-07-25 ENCOUNTER — Encounter (INDEPENDENT_AMBULATORY_CARE_PROVIDER_SITE_OTHER): Payer: Self-pay

## 2016-07-25 ENCOUNTER — Encounter: Payer: Self-pay | Admitting: Cardiovascular Disease

## 2016-07-25 ENCOUNTER — Ambulatory Visit (INDEPENDENT_AMBULATORY_CARE_PROVIDER_SITE_OTHER): Payer: BLUE CROSS/BLUE SHIELD | Admitting: Cardiovascular Disease

## 2016-07-25 VITALS — BP 122/78 | HR 80 | Ht 70.0 in | Wt 199.8 lb

## 2016-07-25 DIAGNOSIS — R079 Chest pain, unspecified: Secondary | ICD-10-CM

## 2016-07-25 DIAGNOSIS — E785 Hyperlipidemia, unspecified: Secondary | ICD-10-CM

## 2016-07-25 DIAGNOSIS — R0789 Other chest pain: Secondary | ICD-10-CM | POA: Diagnosis not present

## 2016-07-25 MED ORDER — ROSUVASTATIN CALCIUM 10 MG PO TABS: 10.0000 mg | ORAL_TABLET | Freq: Every day | ORAL | 11 refills | Status: DC

## 2016-07-25 NOTE — Progress Notes (Signed)
Cardiology Office Note   Date:  07/25/2016   ID:  Mitchell Ruiz, DOB 1961/07/04, MRN [id number]  PCP:  Mitchell Haste, MD  Cardiologist:   Mertie Moores, MD   Chief Complaint  Patient presents with  . Chest Pain   Problem List 1. Chest pain  2. Hyperlipidemia 3. GERD     History of Present Illness: Mitchell Ruiz is a 55 y.o. male who presents for evaluation of some chest pain.  Has had CP in the past Had a coronary CT angio in 2012 - showed minimal disease in the LAD  CORONARY ARTERIES: Left main coronary artery:  Widely patent without calcified or noncalcified plaque.  Left anterior descending:  Non-obstructive noncalcified plaque in the proximal LAD at the origin of a small first diagonal.  5 small diagonal branches identified.  Left circumflex:  Acute angulation at its origin.  No visible calcified or noncalcified plaque.  3 small obtuse marginal branches identified.  Right coronary artery:  Widely patent without calcified or noncalcified plaque.  Posterior descending artery:  Widely patent without calcified or noncalcified plaque.  Dominance:  Right.  CORONARY CALCIUM: Total Agatston Score:  0    Has had intermittant CP  Will go for weeks without any CP and then he will have weeks of CP on the left side of chest  Left sided CP , no radiation, no diaphoresis. Not associated with dyspnea.   Not associated with eating or drinking .   symptoms can be during the day or night .  Occasional tearing like sensation .   No regular exercise but  Does not have any problems with mowing his yard or weed eating .   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 Chest discomfort   . Chronic headaches   . GERD (gastroesophageal reflux disease)   . High cholesterol   . Seizure [facility])     Past Surgical History:  Procedure Laterality Date  . ANKLE SURGERY Left   . FINGER SURGERY  Left    nerve damage, index finger  . MENISCUS REPAIR Left      Current Outpatient Prescriptions  Medication Sig Dispense Refill  . pantoprazole (PROTONIX) 40 MG tablet Take 1 tablet (40 mg total) by mouth daily. 90 tablet 3   No current facility-administered medications for this visit.     Allergies:   Review of patient's allergies indicates no known allergies.    Social History:  The patient  reports that he has never smoked. He has never used smokeless tobacco. He reports that he drinks about 4.2 oz of alcohol per week . He reports that he does not use drugs.   Family History:  The patient's family history includes Alcoholism in his maternal grandfather; Diabetes in his maternal grandfather; Heart failure in his maternal grandmother; Multiple sclerosis in his brother.    ROS:  Please see the history of present illness.    Review of Systems: Constitutional:  denies fever, chills, diaphoresis, appetite change and fatigue.  HEENT: denies photophobia, eye pain, redness, hearing loss, ear pain, congestion, sore throat, rhinorrhea, sneezing, neck pain, neck stiffness and tinnitus.  Respiratory: denies SOB, DOE, cough, chest tightness, and wheezing.  Cardiovascular: denies chest pain, palpitations and leg swelling.  Gastrointestinal: denies nausea, vomiting, abdominal pain, diarrhea, constipation, blood in stool.  Genitourinary: denies dysuria, urgency, frequency, hematuria, flank pain and difficulty urinating.  Musculoskeletal: denies  myalgias, back pain, joint swelling, arthralgias and gait problem.   Skin: denies pallor, rash and wound.  Neurological: denies dizziness, seizures, syncope, weakness, light-headedness, numbness and headaches.   Hematological: denies adenopathy, easy bruising, personal or family bleeding history.  Psychiatric/ Behavioral: denies suicidal ideation, mood changes, confusion, nervousness, sleep disturbance and agitation.       All other systems are  reviewed and negative.    PHYSICAL EXAM: VS:  There were no vitals taken for this visit. , BMI There is no height or weight on file to calculate BMI. GEN: Well nourished, well developed, in no acute distress  HEENT: normal  Neck: no JVD, carotid bruits, or masses Cardiac: RRR; no murmurs, rubs, or gallops,no edema  Respiratory:  clear to auscultation bilaterally, normal work of breathing GI: soft, nontender, nondistended, + BS MS: no deformity or atrophy  Skin: warm and dry, no rash Neuro:  Strength and sensation are intact Psych: normal   EKG:  EKG is ordered today. The ekg ordered today demonstrates  NSR at 70. No ST or T wave changes.    Recent Labs: 03/28/2016: ALT 18; BUN 23; Creat 0.87; Hemoglobin 13.7; Platelets 237; Potassium 4.5; Sodium 139    Lipid Panel    Component Value Date/Time   CHOL 281 (H) 03/28/2016 0001   TRIG 124 03/28/2016 0001   HDL 46 03/28/2016 0001   CHOLHDL 6.1 (H) 03/28/2016 0001   VLDL 25 03/28/2016 0001   LDLCALC 210 (H) 03/28/2016 0001      Wt Readings from Last 3 Encounters:  03/28/16 198 lb 3.2 oz (89.9 kg)  09/24/15 189 lb (85.7 kg)  03/05/15 194 lb 12.8 oz (88.4 kg)      Other studies Reviewed: Additional studies/ records that were reviewed today include: . Review of the above records demonstrates:    ASSESSMENT AND PLAN:  1.  Chest pain:  Many atypical features. Lipids are very elevated We discussed stress myoview - at this point, I do not think his symptoms are cardiac Atypical, seems outside his chest, Not related to exertion ECG is normal Advised him to exercise on a regular basis -  Improve diet   2. Hyperlipidema:  Has been elevated. Needs to improve diet - discussed Needs to exercise  Will start Rosuvastatin 10 mg  a day  Check labs in 3 months   3. Possible sleep apnea : May need a sleep study  Discussed sleep hygiene  Current medicines are reviewed at length with the patient today.  The patient does not  have concerns regarding medicines.  Labs/ tests ordered today include:  No orders of the defined types were placed in this encounter.  We will call him in several weeks to see how he is doing .   Disposition:   FU with me in 6 weeks      Mertie Moores, MD  07/25/2016 10:04 AM    [address], [place], [place]  [address] Phone: [phone]; Fax: [phone]   [facility]  [address] [place], [place]  [address] [phone]   Fax [phone]

## 2016-07-25 NOTE — Patient Instructions (Addendum)
Medication Instructions:  1) START CRESTOR 10 mg daily  Labwork: Your physician recommends that you return for FASTING lab work in: 3 MONTHS   Testing/Procedures: None  Follow-Up: Your physician recommends that you schedule a follow-up appointment in 6 WEEKS with Dr. Acie Fredrickson.  Any Other Special Instructions Will Be Listed Below (If Applicable). Please start exercising regularly.  Fat and Cholesterol Restricted Diet High levels of fat and cholesterol in your blood may lead to various health problems, such as diseases of the heart, blood vessels, gallbladder, liver, and pancreas. Fats are concentrated sources of energy that come in various forms. Certain types of fat, including saturated fat, may be harmful in excess. Cholesterol is a substance needed by your body in small amounts. Your body makes all the cholesterol it needs. Excess cholesterol comes from the food you eat. When you have high levels of cholesterol and saturated fat in your blood, health problems can develop because the excess fat and cholesterol will gather along the walls of your blood vessels, causing them to narrow. Choosing the right foods will help you control your intake of fat and cholesterol. This will help keep the levels of these substances in your blood within normal limits and reduce your risk of disease. WHAT IS MY PLAN? Your health care provider recommends that you:  Get no more than __________ % of the total calories in your daily diet from fat.  Limit your intake of saturated fat to less than ______% of your total calories each day.  Limit the amount of cholesterol in your diet to less than _________mg per day. WHAT TYPES OF FAT SHOULD I CHOOSE?  Choose healthy fats more often. Choose monounsaturated and polyunsaturated fats, such as olive and canola oil, flaxseeds, walnuts, almonds, and seeds.  Eat more omega-3 fats. Good choices include salmon, mackerel, sardines, tuna, flaxseed oil, and ground flaxseeds.  Aim to eat fish at least two times a week.  Limit saturated fats. Saturated fats are primarily found in animal products, such as meats, butter, and cream. Plant sources of saturated fats include palm oil, palm kernel oil, and coconut oil.  Avoid foods with partially hydrogenated oils in them. These contain trans fats. Examples of foods that contain trans fats are stick margarine, some tub margarines, cookies, crackers, and other baked goods. WHAT GENERAL GUIDELINES DO I NEED TO FOLLOW? These guidelines for healthy eating will help you control your intake of fat and cholesterol:  Check food labels carefully to identify foods with trans fats or high amounts of saturated fat.  Fill one half of your plate with vegetables and green salads.  Fill one fourth of your plate with whole grains. Look for the word "whole" as the first word in the ingredient list.  Fill one fourth of your plate with lean protein foods.  Limit fruit to two servings a day. Choose fruit instead of juice.  Eat more foods that contain soluble fiber. Examples of foods that contain this type of fiber are apples, broccoli, carrots, beans, peas, and barley. Aim to get 20-30 g of fiber per day.  Eat more home-cooked food and less restaurant, buffet, and fast food.  Limit or avoid alcohol.  Limit foods high in starch and sugar.  Limit fried foods.  Cook foods using methods other than frying. Baking, boiling, grilling, and broiling are all great options.  Lose weight if you are overweight. Losing just 5-10% of your initial body weight can help your overall health and prevent diseases such as diabetes  and heart disease. WHAT FOODS CAN I EAT? Grains Whole grains, such as whole wheat or whole grain breads, crackers, cereals, and pasta. Unsweetened oatmeal, bulgur, barley, quinoa, or brown rice. Corn or whole wheat flour tortillas. Vegetables Fresh or frozen vegetables (raw, steamed, roasted, or grilled). Green  salads. Fruits All fresh, canned (in natural juice), or frozen fruits. Meat and Other Protein Products Ground beef (85% or leaner), grass-fed beef, or beef trimmed of fat. Skinless chicken or [place]. Ground chicken or [place]. Pork trimmed of fat. All fish and seafood. Eggs. Dried beans, peas, or lentils. Unsalted nuts or seeds. Unsalted canned or dry beans. Dairy Low-fat dairy products, such as skim or 1% milk, 2% or reduced-fat cheeses, low-fat ricotta or cottage cheese, or plain low-fat yogurt. Fats and Oils Tub margarines without trans fats. Light or reduced-fat mayonnaise and salad dressings. Avocado. Olive, canola, sesame, or safflower oils. Natural peanut or almond butter (choose ones without added sugar and oil). The items listed above may not be a complete list of recommended foods or beverages. Contact your dietitian for more options. WHAT FOODS ARE NOT RECOMMENDED? Grains White bread. White pasta. White rice. Cornbread. Bagels, pastries, and croissants. Crackers that contain trans fat. Vegetables White potatoes. Corn. Creamed or fried vegetables. Vegetables in a cheese sauce. Fruits Dried fruits. Canned fruit in light or heavy syrup. Fruit juice. Meat and Other Protein Products Fatty cuts of meat. Ribs, chicken wings, bacon, sausage, bologna, salami, chitterlings, fatback, hot dogs, bratwurst, and packaged luncheon meats. Liver and organ meats. Dairy Whole or 2% milk, cream, half-and-half, and cream cheese. Whole milk cheeses. Whole-fat or sweetened yogurt. Full-fat cheeses. Nondairy creamers and whipped toppings. Processed cheese, cheese spreads, or cheese curds. Sweets and Desserts Corn syrup, sugars, honey, and molasses. Candy. Jam and jelly. Syrup. Sweetened cereals. Cookies, pies, cakes, donuts, muffins, and ice cream. Fats and Oils Butter, stick margarine, lard, shortening, ghee, or bacon fat. Coconut, palm kernel, or palm oils. Beverages Alcohol. Sweetened drinks (such as  sodas, lemonade, and fruit drinks or punches). The items listed above may not be a complete list of foods and beverages to avoid. Contact your dietitian for more information.   This information is not intended to replace advice given to you by your health care provider. Make sure you discuss any questions you have with your health care provider.   Document Released: 12/11/2005 Document Revised: 01/01/2015 Document Reviewed: 03/11/2014 Elsevier Interactive Patient Education [employer].     If you need a refill on your cardiac medications before your next appointment, please call your pharmacy.

## 2016-08-11 ENCOUNTER — Telehealth: Payer: Self-pay | Admitting: Nurse Practitioner

## 2016-08-11 NOTE — Telephone Encounter (Signed)
Left message for patient to call back regarding how he is tolerating the Crestor and to schedule 3 month follow-up appointment.

## 2016-08-18 ENCOUNTER — Telehealth: Payer: Self-pay | Admitting: Cardiovascular Disease

## 2016-08-18 NOTE — Telephone Encounter (Signed)
Pt is not tolerating the Crestor - muscle cramps Will try breaking it in half or 1/4 tabs    Mertie Moores, MD  08/18/2016 5:31 PM    [address],  [place], [place]  [address] Pager [phone] Phone: [phone]; Fax: [phone]

## 2016-08-25 NOTE — Telephone Encounter (Signed)
Dr. Acie Fredrickson reports that he spoke with patient who reports he is feeling well and denies complaints.  He reports he will call the office with questions or concerns.

## 2016-09-27 ENCOUNTER — Ambulatory Visit: Payer: BLUE CROSS/BLUE SHIELD | Admitting: Cardiovascular Disease

## 2016-09-28 ENCOUNTER — Ambulatory Visit: Payer: BLUE CROSS/BLUE SHIELD | Admitting: Cardiovascular Disease

## 2016-10-13 DIAGNOSIS — Z23 Encounter for immunization: Secondary | ICD-10-CM | POA: Diagnosis not present

## 2016-11-30 ENCOUNTER — Ambulatory Visit: Payer: BLUE CROSS/BLUE SHIELD | Admitting: Cardiovascular Disease

## 2016-12-08 ENCOUNTER — Encounter (INDEPENDENT_AMBULATORY_CARE_PROVIDER_SITE_OTHER): Payer: Self-pay

## 2016-12-08 ENCOUNTER — Encounter: Payer: Self-pay | Admitting: Cardiovascular Disease

## 2016-12-08 ENCOUNTER — Ambulatory Visit (INDEPENDENT_AMBULATORY_CARE_PROVIDER_SITE_OTHER): Payer: BLUE CROSS/BLUE SHIELD | Admitting: Cardiovascular Disease

## 2016-12-08 VITALS — BP 112/86 | HR 70 | Ht 70.5 in | Wt 200.8 lb

## 2016-12-08 DIAGNOSIS — K219 Gastro-esophageal reflux disease without esophagitis: Secondary | ICD-10-CM | POA: Diagnosis not present

## 2016-12-08 DIAGNOSIS — E7800 Pure hypercholesterolemia, unspecified: Secondary | ICD-10-CM | POA: Diagnosis not present

## 2016-12-08 MED ORDER — SILDENAFIL CITRATE 20 MG PO TABS: 20.0000 mg | ORAL_TABLET | Freq: Every day | ORAL | 3 refills | Status: DC | PRN

## 2016-12-08 NOTE — Progress Notes (Signed)
Cardiology Office Note   Date:  12/08/2016   ID:  Mitchell Ruiz, DOB 06-17-1961, MRN [id number]  PCP:  Wyatt Haste, MD  Cardiologist:   Mertie Moores, MD   Chief Complaint  Patient presents with  . Follow-up    chest pain    Problem List 1. Chest pain  2. Hyperlipidemia 3. GERD     History of Present Illness: Mitchell Ruiz is a 55 y.o. male who presents for evaluation of some chest pain.  Has had CP in the past Had a coronary CT angio in 2012 - showed minimal disease in the LAD  CORONARY ARTERIES: Left main coronary artery:  Widely patent without calcified or noncalcified plaque.  Left anterior descending:  Non-obstructive noncalcified plaque in the proximal LAD at the origin of a small first diagonal.  5 small diagonal branches identified.  Left circumflex:  Acute angulation at its origin.  No visible calcified or noncalcified plaque.  3 small obtuse marginal branches identified.  Right coronary artery:  Widely patent without calcified or noncalcified plaque.  Posterior descending artery:  Widely patent without calcified or noncalcified plaque.  Dominance:  Right.  CORONARY CALCIUM: Total Agatston Score:  0    Has had intermittant CP  Will go for weeks without any CP and then he will have weeks of CP on the left side of chest  Left sided CP , no radiation, no diaphoresis. Not associated with dyspnea.   Not associated with eating or drinking .   symptoms can be during the day or night .  Occasional tearing like sensation .   No regular exercise but  Does not have any problems with mowing his yard or weed eating .   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 15, 2017:  Mitchell Ruiz is seen for follow up today. Has been getting Wellness checks through work.   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 Has a hx of hyperlipidemia  He is taking the Crestor for 2.5 months  Labs were checked at [facility] ( not available through  [facility] yet)  No further chest pains .   Doing ok with diet.    Past Medical History:  Diagnosis Date  . Chest discomfort   . Chronic headaches   . GERD (gastroesophageal reflux disease)   . High cholesterol   . Seizure [facility])     Past Surgical History:  Procedure Laterality Date  . ANKLE SURGERY Left   . FINGER SURGERY Left    nerve damage, index finger  . MENISCUS REPAIR Left      Current Outpatient Prescriptions  Medication Sig Dispense Refill  . FLUCELVAX QUADRIVALENT 0.5 ML SUSY Inject as directed once.  0  . pantoprazole (PROTONIX) 40 MG tablet Take 1 tablet (40 mg total) by mouth daily. 90 tablet 3  . rosuvastatin (CRESTOR) 10 MG tablet Take 1 tablet (10 mg total) by mouth daily. 30 tablet 11   No current facility-administered medications for this visit.     Allergies:   Patient has no known allergies.    Social History:  The patient  reports that he has never smoked. He has never used smokeless tobacco. He reports that he drinks about 4.2 oz of alcohol per week . He reports that he does not use drugs.   Family History:  The patient's family history includes Alcoholism in his maternal grandfather; Diabetes in his maternal grandfather; Heart failure in his maternal grandmother; Multiple sclerosis in  his brother.    ROS:  Please see the history of present illness.    Review of Systems: Constitutional:  denies fever, chills, diaphoresis, appetite change and fatigue.  HEENT: denies photophobia, eye pain, redness, hearing loss, ear pain, congestion, sore throat, rhinorrhea, sneezing, neck pain, neck stiffness and tinnitus.  Respiratory: denies SOB, DOE, cough, chest tightness, and wheezing.  Cardiovascular: denies chest pain, palpitations and leg swelling.  Gastrointestinal: denies nausea, vomiting, abdominal pain, diarrhea, constipation, blood in stool.  Genitourinary: denies dysuria, urgency, frequency, hematuria, flank pain and difficulty urinating.    Musculoskeletal: denies  myalgias, back pain, joint swelling, arthralgias and gait problem.   Skin: denies pallor, rash and wound.  Neurological: denies dizziness, seizures, syncope, weakness, light-headedness, numbness and headaches.   Hematological: denies adenopathy, easy bruising, personal or family bleeding history.  Psychiatric/ Behavioral: denies suicidal ideation, mood changes, confusion, nervousness, sleep disturbance and agitation.       All other systems are reviewed and negative.    PHYSICAL EXAM: VS:  BP 112/86 (BP Location: Left Arm, Patient Position: Sitting, Cuff Size: Large)   Pulse 70   Ht 5' 10.5" (1.791 m)   Wt 200 lb 12.8 oz (91.1 kg)   BMI 28.40 kg/m  , BMI Body mass index is 28.4 kg/m. GEN: Well nourished, well developed, in no acute distress  HEENT: normal  Neck: no JVD, carotid bruits, or masses Cardiac: RRR; no murmurs, rubs, or gallops,no edema  Respiratory:  clear to auscultation bilaterally, normal work of breathing GI: soft, nontender, nondistended, + BS MS: no deformity or atrophy  Skin: warm and dry, no rash Neuro:  Strength and sensation are intact Psych: normal   EKG:  EKG is not ordered today.    Recent Labs: 03/28/2016: ALT 18; BUN 23; Creat 0.87; Hemoglobin 13.7; Platelets 237; Potassium 4.5; Sodium 139    Lipid Panel    Component Value Date/Time   CHOL 281 (H) 03/28/2016 0001   TRIG 124 03/28/2016 0001   HDL 46 03/28/2016 0001   CHOLHDL 6.1 (H) 03/28/2016 0001   VLDL 25 03/28/2016 0001   LDLCALC 210 (H) 03/28/2016 0001      Wt Readings from Last 3 Encounters:  12/08/16 200 lb 12.8 oz (91.1 kg)  07/25/16 199 lb 12.8 oz (90.6 kg)  03/28/16 198 lb 3.2 oz (89.9 kg)      Other studies Reviewed: Additional studies/ records that were reviewed today include: . Review of the above records demonstrates:    ASSESSMENT AND PLAN:  1.  Chest pain:  Many atypical features. Lipids are very elevated We discussed stress myoview -  at this point, I do not think his symptoms are cardiac Atypical, seems outside his chest, Not related to exertion ECG is normal Advised him to exercise on a regular basis -  Improve diet   2. Hyperlipidema:  He is tolerating the Crestor fairly well. Continue current medications. I will see him again in 6 months and we'll check labs at that time.     Current medicines are reviewed at length with the patient today.  The patient does not have concerns regarding medicines.  Labs/ tests ordered today include:  No orders of the defined types were placed in this encounter.  We will call him in several weeks to see how he is doing .   Disposition:   FU with me in 6 months       Mertie Moores, MD  12/08/2016 8:29 AM    [facility] [address], [place], [place]  [address] Phone: [phone]; Fax: [phone]   [facility]  [address] [place], [place]  [address] [phone]   Fax [phone]

## 2016-12-08 NOTE — Patient Instructions (Signed)

## 2017-04-02 ENCOUNTER — Other Ambulatory Visit: Payer: Self-pay | Admitting: Family Medicine

## 2017-04-02 DIAGNOSIS — K219 Gastro-esophageal reflux disease without esophagitis: Secondary | ICD-10-CM

## 2017-04-10 ENCOUNTER — Encounter: Payer: Self-pay | Admitting: Family Medicine

## 2017-07-19 ENCOUNTER — Encounter: Payer: Self-pay | Admitting: Family Medicine

## 2017-07-19 ENCOUNTER — Other Ambulatory Visit: Payer: Self-pay | Admitting: Family Medicine

## 2017-07-19 ENCOUNTER — Ambulatory Visit (INDEPENDENT_AMBULATORY_CARE_PROVIDER_SITE_OTHER): Payer: BLUE CROSS/BLUE SHIELD | Admitting: Family Medicine

## 2017-07-19 VITALS — BP 120/80 | HR 76 | Wt 198.0 lb

## 2017-07-19 DIAGNOSIS — B029 Zoster without complications: Secondary | ICD-10-CM | POA: Diagnosis not present

## 2017-07-19 DIAGNOSIS — K219 Gastro-esophageal reflux disease without esophagitis: Secondary | ICD-10-CM

## 2017-07-19 MED ORDER — HYDROCODONE-ACETAMINOPHEN 5-325 MG PO TABS: 1.0000 | ORAL_TABLET | Freq: Four times a day (QID) | ORAL | 0 refills | Status: DC | PRN

## 2017-07-19 MED ORDER — VALACYCLOVIR HCL 1 G PO TABS: 1000.0000 mg | ORAL_TABLET | Freq: Two times a day (BID) | ORAL | 0 refills | Status: DC

## 2017-07-19 NOTE — Progress Notes (Signed)
   Subjective:    Patient ID: Mitchell Ruiz, male    DOB: [birth year], 56 y.o.   MRN: [id number]  HPI He notes a several day history of right-sided headache as well as right eye discomfort and now has noted a rash in his scalp. This started roughly 6 days ago however the rash started just 2 days ago.  He also has concerns about his reflux. In the past she had been on pantoprazole however he ran out and recently had some reflux symptoms and was using Zantac. This seemed to work.   Review of Systems     Objective:   Physical Exam Alert and in no distress. Erythematous vesicular rash noted in the right parietal area. The forehead and nose as well as eye appear normal. Ear canal and tympanic membranes normal.       Assessment & Plan:  Herpes zoster without complication - Plan: valACYclovir (VALTREX) 1000 MG tablet, HYDROcodone-acetaminophen ([place]) 5-325 MG tablet, Ambulatory referral to Ophthalmology  Gastroesophageal reflux disease, esophagitis presence not specified I discussed treatment of herpes with him in detail and gave him information concerning this. Recommend he use ibuprofen 800 3 times a day and then hydrocodone as needed. Discussed pH and with him. He will be referred to ophthalmology. Recommend he continue to use the Zantac on an as-needed basis for his reflux and to avoid a PPI if at all possible extending the possible side effects of long-term use of PPI.

## 2017-07-19 NOTE — Patient Instructions (Addendum)
You can  Shingles Shingles, which is also known as herpes zoster, is an infection that causes a painful skin rash and fluid-filled blisters. Shingles is not related to genital herpes, which is a sexually transmitted infection. Shingles only develops in people who:  Have had chickenpox.  Have received the chickenpox vaccine. (This is rare.)  What are the causes? Shingles is caused by varicella-zoster virus (VZV). This is the same virus that causes chickenpox. After exposure to VZV, the virus stays in the body in an inactive (dormant) state. Shingles develops if the virus reactivates. This can happen many years after the initial exposure to VZV. It is not known what causes this virus to reactivate. What increases the risk? People who have had chickenpox or received the chickenpox vaccine are at risk for shingles. Infection is more common in people who:  Are older than age 56.  Have a weakened defense (immune) system, such as those with HIV, AIDS, or cancer.  Are taking medicines that weaken the immune system, such as transplant medicines.  Are under great stress.  What are the signs or symptoms? Early symptoms of this condition include itching, tingling, and pain in an area on your skin. Pain may be described as burning, stabbing, or throbbing. A few days or weeks after symptoms start, a painful red rash appears, usually on one side of the body in a bandlike or beltlike pattern. The rash eventually turns into fluid-filled blisters that break open, scab over, and dry up in about 2-3 weeks. At any time during the infection, you may also develop:  A fever.  Chills.  A headache.  An upset stomach.  How is this diagnosed? This condition is diagnosed with a skin exam. Sometimes, skin or fluid samples are taken from the blisters before a diagnosis is made. These samples are examined under a microscope or sent to a lab for testing. How is this treated? There is no specific cure for this  condition. Your health care provider will probably prescribe medicines to help you manage pain, recover more quickly, and avoid long-term problems. Medicines may include:  Antiviral drugs.  Anti-inflammatory drugs.  Pain medicines.  If the area involved is on your face, you may be referred to a specialist, such as an eye doctor (ophthalmologist) or an ear, nose, and throat (ENT) doctor to help you avoid eye problems, chronic pain, or disability. Follow these instructions at home: Medicines  Take medicines only as directed by your health care provider.  Apply an anti-itch or numbing cream to the affected area as directed by your health care provider. Blister and Rash Care  Take a cool bath or apply cool compresses to the area of the rash or blisters as directed by your health care provider. This may help with pain and itching.  Keep your rash covered with a loose bandage (dressing). Wear loose-fitting clothing to help ease the pain of material rubbing against the rash.  Keep your rash and blisters clean with mild soap and cool water or as directed by your health care provider.  Check your rash every day for signs of infection. These include redness, swelling, and pain that lasts or increases.  Do not pick your blisters.  Do not scratch your rash. General instructions  Rest as directed by your health care provider.  Keep all follow-up visits as directed by your health care provider. This is important.  Until your blisters scab over, your infection can cause chickenpox in people who have never had it  or been vaccinated against it. To prevent this from happening, avoid contact with other people, especially: ? Babies. ? Pregnant women. ? Children who have eczema. ? Elderly people who have transplants. ? People who have chronic illnesses, such as leukemia or AIDS. Contact a health care provider if:  Your pain is not relieved with prescribed medicines.  Your pain does not get  better after the rash heals.  Your rash looks infected. Signs of infection include redness, swelling, and pain that lasts or increases. Get help right away if:  The rash is on your face or nose.  You have facial pain, pain around your eye area, or loss of feeling on one side of your face.  You have ear pain or you have ringing in your ear.  You have loss of taste.  Your condition gets worse. This information is not intended to replace advice given to you by your health care provider. Make sure you discuss any questions you have with your health care provider. Document Released: 12/11/2005 Document Revised: 08/06/2016 Document Reviewed: 10/22/2014 Elsevier Interactive Patient Education  2017 [place]. take 800 mg of ibuprofen 3 times per day and also the codeine if needed

## 2017-07-20 DIAGNOSIS — B0239 Other herpes zoster eye disease: Secondary | ICD-10-CM | POA: Diagnosis not present

## 2017-07-20 DIAGNOSIS — H2513 Age-related nuclear cataract, bilateral: Secondary | ICD-10-CM | POA: Diagnosis not present

## 2017-08-10 DIAGNOSIS — H2513 Age-related nuclear cataract, bilateral: Secondary | ICD-10-CM | POA: Diagnosis not present

## 2017-11-29 ENCOUNTER — Ambulatory Visit (INDEPENDENT_AMBULATORY_CARE_PROVIDER_SITE_OTHER): Payer: BLUE CROSS/BLUE SHIELD | Admitting: Family Medicine

## 2017-11-29 ENCOUNTER — Encounter: Payer: Self-pay | Admitting: Family Medicine

## 2017-11-29 VITALS — BP 120/70 | HR 76 | Resp 16 | Ht 69.5 in | Wt 194.2 lb

## 2017-11-29 DIAGNOSIS — Z0000 Encounter for general adult medical examination without abnormal findings: Secondary | ICD-10-CM

## 2017-11-29 DIAGNOSIS — K649 Unspecified hemorrhoids: Secondary | ICD-10-CM | POA: Diagnosis not present

## 2017-11-29 DIAGNOSIS — Z86010 Personal history of colonic polyps: Secondary | ICD-10-CM

## 2017-11-29 DIAGNOSIS — Z23 Encounter for immunization: Secondary | ICD-10-CM | POA: Diagnosis not present

## 2017-11-29 DIAGNOSIS — D229 Melanocytic nevi, unspecified: Secondary | ICD-10-CM

## 2017-11-29 DIAGNOSIS — L821 Other seborrheic keratosis: Secondary | ICD-10-CM | POA: Diagnosis not present

## 2017-11-29 DIAGNOSIS — M1990 Unspecified osteoarthritis, unspecified site: Secondary | ICD-10-CM

## 2017-11-29 DIAGNOSIS — K219 Gastro-esophageal reflux disease without esophagitis: Secondary | ICD-10-CM

## 2017-11-29 DIAGNOSIS — Z87898 Personal history of other specified conditions: Secondary | ICD-10-CM

## 2017-11-29 DIAGNOSIS — G43109 Migraine with aura, not intractable, without status migrainosus: Secondary | ICD-10-CM

## 2017-11-29 DIAGNOSIS — E785 Hyperlipidemia, unspecified: Secondary | ICD-10-CM | POA: Diagnosis not present

## 2017-11-29 LAB — POCT URINALYSIS DIP (PROADVANTAGE DEVICE)
BILIRUBIN UA: NEGATIVE
BILIRUBIN UA: NEGATIVE mg/dL
Blood, UA: NEGATIVE
Glucose, UA: NEGATIVE mg/dL
LEUKOCYTES UA: NEGATIVE
Nitrite, UA: NEGATIVE
Specific Gravity, Urine: 1.025
Urobilinogen, Ur: NEGATIVE
pH, UA: 6 (ref 5.0–8.0)

## 2017-11-29 LAB — LIPID PANEL
Cholesterol: 262 mg/dL — ABNORMAL HIGH (ref <200)
HDL: 48 mg/dL (ref >40)
LDL Cholesterol (Calc): 193 mg/dL (calc) — ABNORMAL HIGH
NON-HDL CHOLESTEROL (CALC): 214 mg/dL — AB (ref <130)
TRIGLYCERIDES: 93 mg/dL (ref <150)
Total CHOL/HDL Ratio: 5.5 (calc) — ABNORMAL HIGH (ref <5.0)

## 2017-11-29 LAB — COMPREHENSIVE METABOLIC PANEL
AG RATIO: 1.5 (calc) (ref 1.0–2.5)
ALKALINE PHOSPHATASE (APISO): 44 U/L (ref 40–115)
ALT: 17 U/L (ref 9–46)
AST: 16 U/L (ref 10–35)
Albumin: 4.6 g/dL (ref 3.6–5.1)
BILIRUBIN TOTAL: 0.7 mg/dL (ref 0.2–1.2)
BUN: 18 mg/dL (ref 7–25)
CALCIUM: 9.7 mg/dL (ref 8.6–10.3)
CHLORIDE: 103 mmol/L (ref 98–110)
CO2: 30 mmol/L (ref 20–32)
Creat: 0.94 mg/dL (ref 0.70–1.33)
GLOBULIN: 3.1 g/dL (ref 1.9–3.7)
Glucose, Bld: 94 mg/dL (ref 65–99)
Potassium: 4.8 mmol/L (ref 3.5–5.3)
Sodium: 141 mmol/L (ref 135–146)
Total Protein: 7.7 g/dL (ref 6.1–8.1)

## 2017-11-29 LAB — CBC WITH DIFFERENTIAL/PLATELET
BASOS ABS: 20 {cells}/uL (ref 0–200)
BASOS PCT: 0.4 %
EOS ABS: 71 {cells}/uL (ref 15–500)
Eosinophils Relative: 1.4 %
HEMATOCRIT: 39.9 % (ref 38.5–50.0)
HEMOGLOBIN: 13.7 g/dL (ref 13.2–17.1)
Lymphs Abs: 1275 cells/uL (ref 850–3900)
MCH: 29 pg (ref 27.0–33.0)
MCHC: 34.3 g/dL (ref 32.0–36.0)
MCV: 84.5 fL (ref 80.0–100.0)
MONOS PCT: 8.3 %
MPV: 11.2 fL (ref 7.5–12.5)
NEUTROS ABS: 3310 {cells}/uL (ref 1500–7800)
Neutrophils Relative %: 64.9 %
Platelets: 259 10*3/uL (ref 140–400)
RBC: 4.72 10*6/uL (ref 4.20–5.80)
RDW: 12.7 % (ref 11.0–15.0)
Total Lymphocyte: 25 %
WBC: 5.1 10*3/uL (ref 3.8–10.8)
WBCMIX: 423 {cells}/uL (ref 200–950)

## 2017-11-29 NOTE — Progress Notes (Addendum)
Subjective:    Patient ID: Mitchell Ruiz, male    DOB: 02/19/1961, 56 y.o.   MRN: [id number]  HPI He is here for complete examination.  He does have some lesions that he would like me to look at.  He also was noted swelling in the anal area.  He states that his bowel movements have been normal.  He is having no pain and has seen no blood.  He does have underlying reflux and continues on Protonix for this.  He notes that if he stops for several days his symptoms recur.  He was given some sildenafil but is really not using them but does find it helpful to have them around.  He has a remote history of seizures but has not had any in 15 years.  He has noted some slight knee discomfort on the right.  He does have a previous history of meniscal surgery.  The pain mainly bothers him when he is more physically active.  He is having no difficulty with his migraines.  His work and home life are going well.  Otherwise his family and social history as well as health maintenance and his immunizations were reviewed. He has not been taking his Crestor.  Review of Systems  All other systems reviewed and are negative.      Objective:   Physical Exam BP 120/70   Pulse 76   Resp 16   Ht 5' 9.5" (1.765 m)   Wt 194 lb 3.2 oz (88.1 kg)   SpO2 98%   BMI 28.27 kg/m   General Appearance:    Alert, cooperative, no distress, appears stated age  Head:    Normocephalic, without obvious abnormality, atraumatic  Eyes:    PERRL, conjunctiva/corneas clear, EOM's intact, fundi    benign  Ears:    Normal TM's and external ear canals  Nose:   Nares normal, mucosa normal, no drainage or sinus   tenderness  Throat:   Lips, mucosa, and tongue normal; teeth and gums normal  Neck:   Supple, no lymphadenopathy;  thyroid:  no   enlargement/tenderness/nodules; no carotid   bruit or JVD  Back:    Spine nontender, no curvature, ROM normal, no CVA     tenderness  Lungs:     Clear to auscultation bilaterally without wheezes,  rales or     ronchi; respirations unlabored  Chest Wall:    No tenderness or deformity   Heart:    Regular rate and rhythm, S1 and S2 normal, no murmur, rub   or gallop     Abdomen:     Soft, non-tender, nondistended, normoactive bowel sounds,    no masses, no hepatosplenomegaly  Genitalia:    Normal male external genitalia without lesions.  Testicles without masses.  No inguinal hernias.  Rectal:   A 1-1/2 cm hemorrhoid is noted at 3:00.  It is slightly discolored but nontender to palpation and no bleeding noted.  Extremities:   No clubbing, cyanosis or edema  Pulses:   2+ and symmetric all extremities  Skin:   Skin color, texture, turgor normal, no rashes pigmented well demarcated slightly raised lesion noted on back area.  He does have several other moles present on the upper anterior chest.  Lymph nodes:   Cervical, supraclavicular, and axillary nodes normal  Neurologic:   CNII-XII intact, normal strength, sensation and gait; reflexes 2+ and symmetric throughout          Psych:   Normal mood, affect, hygiene  and grooming.          Assessment & Plan:  Routine general medical examination at a health care facility - Plan: POCT Urinalysis DIP (Proadvantage Device), CBC with Differential/Platelet, Comprehensive metabolic panel, Lipid panel  History of colonic polyps - Tubular adenoma  Gastroesophageal reflux disease, esophagitis presence not specified  Migraine with aura and without status migrainosus, not intractable  Hyperlipidemia, unspecified hyperlipidemia type - Plan: Lipid panel  History of seizure  Need for influenza vaccination - Plan: Flu Vaccine QUAD 6+ mos PF IM (Fluarix Quad PF)  Seborrheic keratosis  Hemorrhoids, unspecified hemorrhoid type He has been set up for appropriate follow-up for his colonoscopy. Also discussed using the Protonix on an as-needed basis for his reflux symptoms.    no further intervention needed for the hyperlipidemia or seizure. Explained  that the lesions are all benign and not in need of having anything done. Discussed the hemorrhoid with him.  Explained that at this point since he is not having any pain no major intervention necessary.  Recommend continuing to have soft BMs and use warm soaks on the area.  Cautioned that if the pain starts to occur, call me as this could easily be an early thrombosis. I then explained the arthritis that he is having and explained that with time this can potentially get worse.  Discussed symptomatic treatment of this and progression on to needing more and more medication and essentially down the road possibly needing replacement but explained that this could be several years or even decades down the road. Recommend he return here in several months for recheck on his lipids.

## 2017-11-29 NOTE — Patient Instructions (Signed)
Use heat for 20 minutes a couple times per day and also make sure you have softer BMs.  You can also use Tucks

## 2017-11-30 ENCOUNTER — Telehealth: Payer: Self-pay | Admitting: Family Medicine

## 2017-11-30 MED ORDER — ROSUVASTATIN CALCIUM 10 MG PO TABS: 10.0000 mg | ORAL_TABLET | Freq: Every day | ORAL | 3 refills | Status: DC

## 2017-11-30 NOTE — Telephone Encounter (Signed)
Notified pt about Biometric Screening Form--ready to be pick up front office.Marland Kitchen

## 2017-11-30 NOTE — Addendum Note (Signed)
Addended by: Denita Lung on: 11/30/2017 12:26 PM   Modules accepted: Orders

## 2018-02-06 ENCOUNTER — Other Ambulatory Visit: Payer: Self-pay | Admitting: Family Medicine

## 2018-02-06 DIAGNOSIS — K219 Gastro-esophageal reflux disease without esophagitis: Secondary | ICD-10-CM

## 2018-05-15 ENCOUNTER — Other Ambulatory Visit: Payer: Self-pay | Admitting: Family Medicine

## 2018-05-15 DIAGNOSIS — K219 Gastro-esophageal reflux disease without esophagitis: Secondary | ICD-10-CM

## 2018-08-20 ENCOUNTER — Other Ambulatory Visit: Payer: Self-pay | Admitting: Family Medicine

## 2018-08-20 DIAGNOSIS — K219 Gastro-esophageal reflux disease without esophagitis: Secondary | ICD-10-CM

## 2018-12-02 ENCOUNTER — Other Ambulatory Visit: Payer: Self-pay | Admitting: Family Medicine

## 2018-12-02 DIAGNOSIS — K219 Gastro-esophageal reflux disease without esophagitis: Secondary | ICD-10-CM

## 2019-01-25 ENCOUNTER — Other Ambulatory Visit: Payer: Self-pay | Admitting: Family Medicine

## 2019-02-27 ENCOUNTER — Encounter: Payer: Self-pay | Admitting: Family Medicine

## 2019-03-11 ENCOUNTER — Other Ambulatory Visit: Payer: Self-pay | Admitting: Family Medicine

## 2019-03-11 DIAGNOSIS — K219 Gastro-esophageal reflux disease without esophagitis: Secondary | ICD-10-CM

## 2019-06-18 ENCOUNTER — Other Ambulatory Visit: Payer: Self-pay | Admitting: Family Medicine

## 2019-06-18 DIAGNOSIS — K219 Gastro-esophageal reflux disease without esophagitis: Secondary | ICD-10-CM

## 2019-06-18 NOTE — Telephone Encounter (Signed)
Is this ok to refill?  

## 2020-03-29 ENCOUNTER — Other Ambulatory Visit: Payer: Self-pay | Admitting: Family Medicine

## 2020-04-28 ENCOUNTER — Telehealth: Payer: Self-pay | Admitting: Family Medicine

## 2020-04-28 ENCOUNTER — Other Ambulatory Visit: Payer: Self-pay | Admitting: Family Medicine

## 2020-04-28 NOTE — Telephone Encounter (Signed)
Pt. Is requesting a refill and has not been seen since 11/29/17 and has no future apt.

## 2020-04-28 NOTE — Telephone Encounter (Signed)
He needs to come in for an office visit.  Cover him with the med until he can come in

## 2020-04-28 NOTE — Telephone Encounter (Signed)
Pt called and made a CPE appt. Does not have enough rosuvastatin to last until then. Please send to [facility] on [address] at [address].

## 2020-04-28 NOTE — Telephone Encounter (Signed)
30 day supply has already been sent on today and his CPE is on 05/17/20 that should be enough to last until then.

## 2020-04-28 NOTE — Telephone Encounter (Signed)
I called pt. LM for him to call back to get scheduled for an office visit.

## 2020-05-17 ENCOUNTER — Ambulatory Visit (INDEPENDENT_AMBULATORY_CARE_PROVIDER_SITE_OTHER): Payer: BC Managed Care – PPO | Admitting: Family Medicine

## 2020-05-17 ENCOUNTER — Other Ambulatory Visit: Payer: Self-pay

## 2020-05-17 ENCOUNTER — Encounter: Payer: Self-pay | Admitting: Family Medicine

## 2020-05-17 VITALS — BP 110/72 | HR 75 | Temp 98.4°F | Ht 69.5 in | Wt 186.8 lb

## 2020-05-17 DIAGNOSIS — M1990 Unspecified osteoarthritis, unspecified site: Secondary | ICD-10-CM

## 2020-05-17 DIAGNOSIS — Z131 Encounter for screening for diabetes mellitus: Secondary | ICD-10-CM | POA: Diagnosis not present

## 2020-05-17 DIAGNOSIS — G43109 Migraine with aura, not intractable, without status migrainosus: Secondary | ICD-10-CM

## 2020-05-17 DIAGNOSIS — Z87898 Personal history of other specified conditions: Secondary | ICD-10-CM

## 2020-05-17 DIAGNOSIS — Z0000 Encounter for general adult medical examination without abnormal findings: Secondary | ICD-10-CM

## 2020-05-17 DIAGNOSIS — E785 Hyperlipidemia, unspecified: Secondary | ICD-10-CM

## 2020-05-17 DIAGNOSIS — K219 Gastro-esophageal reflux disease without esophagitis: Secondary | ICD-10-CM

## 2020-05-17 DIAGNOSIS — Z86010 Personal history of colonic polyps: Secondary | ICD-10-CM

## 2020-05-17 LAB — POCT URINALYSIS DIP (PROADVANTAGE DEVICE)
Glucose, UA: NEGATIVE mg/dL
Ketones, POC UA: NEGATIVE mg/dL
Leukocytes, UA: NEGATIVE
Nitrite, UA: NEGATIVE
Specific Gravity, Urine: 1.03
Urobilinogen, Ur: 0.2
pH, UA: 6 (ref 5.0–8.0)

## 2020-05-17 MED ORDER — PANTOPRAZOLE SODIUM 40 MG PO TBEC: DELAYED_RELEASE_TABLET | ORAL | 3 refills | Status: DC

## 2020-05-17 MED ORDER — ROSUVASTATIN CALCIUM 10 MG PO TABS: ORAL_TABLET | ORAL | 3 refills | Status: DC

## 2020-05-17 NOTE — Progress Notes (Signed)
   Subjective:    Patient ID: Mitchell Ruiz, male    DOB: 01-01-1961, 59 y.o.   MRN: [id number]  HPI He is here for complete examination.  He continues on Crestor and is having no aches or pains with that.  He is also using Protonix for his GERD and is using this intermittently.  He has a remote history of seizures but his last one was over 15 years ago.  He also has a history of tubular adenoma and does need a follow-up colonoscopy.  He does complain of arthritic aches and pains in his hands as well as knees.  He has had previous surgery on his left knee.  He has used sildenafil in the past but recently apparently has not had any need.  His marriage and home life are going well.  He is taking care of a nephew and has a grandchild living with him as well.  Family and social history as well as health maintenance and immunizations was reviewed   Review of Systems  All other systems reviewed and are negative.      Objective:   Physical Exam Alert and in no distress. Tympanic membranes and canals are normal. Pharyngeal area is normal. Neck is supple without adenopathy or thyromegaly. Cardiac exam shows a regular sinus rhythm without murmurs or gallops. Lungs are clear to auscultation. Abdominal exam shows no masses or tenderness with normal bowel sounds       Assessment & Plan:  Routine general medical examination at a health care facility - Plan: CBC with Differential/Platelet, Comprehensive metabolic panel, Lipid panel, POCT Urinalysis DIP (Proadvantage Device)  Arthritis  Hyperlipidemia, unspecified hyperlipidemia type - Plan: Lipid panel  History of seizure  Migraine with aura and without status migrainosus, not intractable  Gastroesophageal reflux disease, unspecified whether esophagitis present  Screening for diabetes mellitus - Plan: Hemoglobin A1c  History of colonic polyps - Plan: Ambulatory referral to Gastroenterology  I explained that the arthritis symptoms he is  having are very common and recommend starting with Tylenol for control of this.  The hemoglobin A1c was ordered because of his insurance plan.

## 2020-05-18 ENCOUNTER — Telehealth: Payer: Self-pay

## 2020-05-18 LAB — LIPID PANEL
Chol/HDL Ratio: 3.3 ratio (ref 0.0–5.0)
Cholesterol, Total: 188 mg/dL (ref 100–199)
HDL: 57 mg/dL (ref >39)
LDL Chol Calc (NIH): 113 mg/dL — ABNORMAL HIGH (ref 0–99)
Triglycerides: 100 mg/dL (ref 0–149)
VLDL Cholesterol Cal: 18 mg/dL (ref 5–40)

## 2020-05-18 LAB — COMPREHENSIVE METABOLIC PANEL
ALT: 20 IU/L (ref 0–44)
AST: 23 IU/L (ref 0–40)
Albumin/Globulin Ratio: 2 (ref 1.2–2.2)
Albumin: 5.1 g/dL — ABNORMAL HIGH (ref 3.8–4.9)
Alkaline Phosphatase: 49 IU/L (ref 48–121)
BUN/Creatinine Ratio: 26 — ABNORMAL HIGH (ref 9–20)
BUN: 27 mg/dL — ABNORMAL HIGH (ref 6–24)
Bilirubin Total: 0.8 mg/dL (ref 0.0–1.2)
CO2: 24 mmol/L (ref 20–29)
Calcium: 9.5 mg/dL (ref 8.7–10.2)
Chloride: 102 mmol/L (ref 96–106)
Creatinine, Ser: 1.02 mg/dL (ref 0.76–1.27)
GFR calc Af Amer: 93 mL/min/{1.73_m2} (ref >59)
GFR calc non Af Amer: 81 mL/min/{1.73_m2} (ref >59)
Globulin, Total: 2.5 g/dL (ref 1.5–4.5)
Glucose: 88 mg/dL (ref 65–99)
Potassium: 4.3 mmol/L (ref 3.5–5.2)
Sodium: 139 mmol/L (ref 134–144)
Total Protein: 7.6 g/dL (ref 6.0–8.5)

## 2020-05-18 LAB — HEMOGLOBIN A1C
Est. average glucose Bld gHb Est-mCnc: 105 mg/dL
Hgb A1c MFr Bld: 5.3 % (ref 4.8–5.6)

## 2020-05-18 LAB — CBC WITH DIFFERENTIAL/PLATELET
Basophils Absolute: 0 10*3/uL (ref 0.0–0.2)
Basos: 1 %
EOS (ABSOLUTE): 0.1 10*3/uL (ref 0.0–0.4)
Eos: 1 %
Hematocrit: 41.3 % (ref 37.5–51.0)
Hemoglobin: 13.7 g/dL (ref 13.0–17.7)
Immature Grans (Abs): 0 10*3/uL (ref 0.0–0.1)
Immature Granulocytes: 0 %
Lymphocytes Absolute: 1.8 10*3/uL (ref 0.7–3.1)
Lymphs: 28 %
MCH: 29.5 pg (ref 26.6–33.0)
MCHC: 33.2 g/dL (ref 31.5–35.7)
MCV: 89 fL (ref 79–97)
Monocytes Absolute: 0.6 10*3/uL (ref 0.1–0.9)
Monocytes: 9 %
Neutrophils Absolute: 3.9 10*3/uL (ref 1.4–7.0)
Neutrophils: 61 %
Platelets: 233 10*3/uL (ref 150–450)
RBC: 4.65 x10E6/uL (ref 4.14–5.80)
RDW: 12.7 % (ref 11.6–15.4)
WBC: 6.4 10*3/uL (ref 3.4–10.8)

## 2020-05-18 NOTE — Telephone Encounter (Signed)
Called pt to advise Dr. Redmond School would like him to come by and give another U/A and paperwork is ready to be picked up . No answer LVM. [place]

## 2020-05-19 ENCOUNTER — Telehealth: Payer: Self-pay

## 2020-05-19 NOTE — Telephone Encounter (Signed)
LVM for pt to advise that we need another urine sample and paperwork is completed. When he comes in we need to measure his waist. [place]

## 2020-05-21 ENCOUNTER — Other Ambulatory Visit: Payer: Self-pay

## 2020-05-21 ENCOUNTER — Telehealth: Payer: Self-pay

## 2020-05-21 ENCOUNTER — Other Ambulatory Visit (INDEPENDENT_AMBULATORY_CARE_PROVIDER_SITE_OTHER): Payer: BC Managed Care – PPO

## 2020-05-21 DIAGNOSIS — R8290 Unspecified abnormal findings in urine: Secondary | ICD-10-CM

## 2020-05-21 LAB — POCT URINALYSIS DIP (PROADVANTAGE DEVICE)
Bilirubin, UA: NEGATIVE
Blood, UA: NEGATIVE
Glucose, UA: NEGATIVE mg/dL
Ketones, POC UA: NEGATIVE mg/dL
Leukocytes, UA: NEGATIVE
Nitrite, UA: NEGATIVE
Protein Ur, POC: NEGATIVE mg/dL
Specific Gravity, Urine: 1.015
Urobilinogen, Ur: 0.2
pH, UA: 6 (ref 5.0–8.0)

## 2020-05-21 NOTE — Telephone Encounter (Signed)
Let him know that the urinalysis is negative

## 2020-05-21 NOTE — Telephone Encounter (Signed)
Pt had repeat U/A today everything was normal. Please advise [facility]

## 2020-05-25 NOTE — Telephone Encounter (Signed)
Pt was called to advised of U/A . No answer lvm for pt to call back [facility].

## 2020-05-26 NOTE — Telephone Encounter (Signed)
Pt was advised Mitchell Ruiz 

## 2020-05-28 ENCOUNTER — Other Ambulatory Visit: Payer: Self-pay | Admitting: Family Medicine

## 2020-05-28 DIAGNOSIS — E785 Hyperlipidemia, unspecified: Secondary | ICD-10-CM

## 2020-07-21 ENCOUNTER — Other Ambulatory Visit: Payer: Self-pay

## 2020-07-21 ENCOUNTER — Ambulatory Visit: Payer: BLUE CROSS/BLUE SHIELD | Attending: Internal Medicine

## 2020-07-21 DIAGNOSIS — Z20822 Contact with and (suspected) exposure to covid-19: Secondary | ICD-10-CM

## 2020-07-22 LAB — SARS-COV-2, NAA 2 DAY TAT

## 2020-07-22 LAB — NOVEL CORONAVIRUS, NAA: SARS-CoV-2, NAA: NOT DETECTED

## 2021-05-27 DIAGNOSIS — M25562 Pain in left knee: Secondary | ICD-10-CM | POA: Diagnosis not present

## 2021-06-01 ENCOUNTER — Encounter: Payer: Self-pay | Admitting: Family Medicine

## 2021-06-01 ENCOUNTER — Ambulatory Visit (INDEPENDENT_AMBULATORY_CARE_PROVIDER_SITE_OTHER): Payer: BC Managed Care – PPO | Admitting: Family Medicine

## 2021-06-01 ENCOUNTER — Other Ambulatory Visit: Payer: Self-pay

## 2021-06-01 VITALS — BP 130/80 | HR 76 | Temp 98.2°F | Ht 70.0 in | Wt 192.2 lb

## 2021-06-01 DIAGNOSIS — R109 Unspecified abdominal pain: Secondary | ICD-10-CM

## 2021-06-01 DIAGNOSIS — Z86010 Personal history of colonic polyps: Secondary | ICD-10-CM

## 2021-06-01 DIAGNOSIS — R1084 Generalized abdominal pain: Secondary | ICD-10-CM | POA: Diagnosis not present

## 2021-06-01 LAB — POCT URINALYSIS DIP (PROADVANTAGE DEVICE)
Bilirubin, UA: NEGATIVE
Glucose, UA: NEGATIVE mg/dL
Ketones, POC UA: NEGATIVE mg/dL
Leukocytes, UA: NEGATIVE
Nitrite, UA: NEGATIVE
Protein Ur, POC: NEGATIVE mg/dL
Specific Gravity, Urine: 1.02
Urobilinogen, Ur: NEGATIVE
pH, UA: 6 (ref 5.0–8.0)

## 2021-06-01 NOTE — Patient Instructions (Addendum)
Your abdominal pain is somewhat confusing, as it is nonspecific.  Your urine looked normal (you need to drink more water). I suspect, based on timing of your symptoms, that it might be related to the GI bug that has been in your house.  Eat a bland diet. (avoid spicy, greasy, fatty foods) Stay well hydrated. Try and eat a high fiber diet (in general, this is recommended)  Consider trying simethicone when you have any abdominal discomfort (Gas-X). I suggest trying a 2 week trial of a probiotic (ie Align) to see if this helps get your stomach back to normal.  You are past due for repeat colonoscopy, due to having had an adenomatous polyp on the colonoscopy in 01/2015.  5 year follow-up was recommended. Please contact Dr. Blanch Media office ([facility]) to schedule this.

## 2021-06-01 NOTE — Progress Notes (Signed)
Chief Complaint  Patient presents with  . Abdominal Pain    5 days ago had lower abdominal pain in the mornings and would have small soft stools. Not eating as much as usual due to stomach not feeling well. Not much appetite. Used peppermint oil topically, didn't help.   . Immunizations    Would like COVID booster, when feeling better    5-7 days ago, he woke up andhad some discomfort across his lower abdomen.  Resolved after a bowel movement; BM was softer than normal. He is having more frequent bowel movements, small amounts, soft. He hasn't been eating as much, trying to drink more water.  Today, had a bowel movement this morning which was soft, had urgency (felt like he needed to go, had some of the discomfort). Twice later today, but was unable to have a bowel movement.  Sometimes pain is higher up in the stomach, sometimes some pain in his back, also still having the lower abdominal pain.Marland Kitchen  He has taken [employer], which helped with the discomfort.  No fever, chills. He had very mild nausea last night, no vomiting. Bowels are more frequent, but small, soft. Not eating much. Denies dietary changes, avoids fried foods, not much dairy. No mucus or blood in the stools.  Black only after taking pepto bismol.  GERD is well controlled (has symptoms only if he misses doses, and he hasn't missed any).  56 yo granddaughter had a stomach bug about a week ago (lives with them).  Wife took care of her, then wife was sick for 24 hours.  Recently learned of a friend who died from pancreatic cancer within a month after diagnosis. He is worried about cancer, asking if there are blood tests for cancer.  PMH, PSH, SH reviewed. Chart reviewed--he last saw Dr. Redmond School a year ago for a physical, has none scheduled for this year.   H/o GERD, taking protonix for 3-4 years. On cholesterol meds x 2-3 years  Colonoscopy 01/2015, tubular adenoma, supposed to have 5 year follow-up, hasn't  done.   Outpatient Encounter Medications as of 06/01/2021  Medication Sig Note  . bismuth subsalicylate (PEPTO BISMOL) 262 MG/15ML suspension Take 30 mLs by mouth every 6 (six) hours as needed. 06/01/2021: Last dose around lunch today.   . pantoprazole (PROTONIX) 40 MG tablet TAKE 1 TABLET(40 MG) BY MOUTH DAILY   . rosuvastatin (CRESTOR) 10 MG tablet TAKE 1 TABLET(10 MG) BY MOUTH DAILY   . sildenafil (REVATIO) 20 MG tablet Take 1 tablet (20 mg total) by mouth daily as needed (Take 1-5 tabs as needed). (Patient not taking: No sig reported)   . valACYclovir (VALTREX) 1000 MG tablet Take 1 tablet (1,000 mg total) by mouth 2 (two) times daily. (Patient not taking: No sig reported)    No facility-administered encounter medications on file as of 06/01/2021.   No Known Allergies  ROS: no fever, chills, URI symptoms, urinary complaints. +mild nausea and abdominal pain with bowel changes per HPI.  No melena, hematochezia (only black stool after pepto bismol), mucus in the stool.  No weight changes.  Eating a little less recently. See HPI.   PHYSICAL EXAM:  BP 130/80   Pulse 76   Temp 98.2 F (36.8 C) (Tympanic)   Ht 5' 10" (1.778 m)   Wt 192 lb 3.2 oz (87.2 kg)   BMI 27.58 kg/m   Wt Readings from Last 3 Encounters:  06/01/21 192 lb 3.2 oz (87.2 kg)  05/17/20 186 lb 12.8 oz (  84.7 kg)  11/29/17 194 lb 3.2 oz (88.1 kg)    Well-appearing, pleasant male, in no distress HEENT: conjunctiva and sclera are clear, EOMI, wearing mask Neck: no lymphadenopathy or mass Heart: regular rate and rhythm, no murmur Lungs: clear bilaterally Back: no CVA tenderness Abdomen: soft, nontender, no organomegaly or mass. No rebound, guarding or mass.  Extremities: no edema Psych: mildly anxious, normal eye contact, speech, hygiene and grooming  Urine: SG 1.020, trace blood   ASSESSMENT/PLAN:  Abdominal pain, unspecified abdominal location - Plan: POCT Urinalysis DIP (Proadvantage Device)  Generalized  abdominal pain - Plan: CBC with Differential/Platelet, Comprehensive metabolic panel, Lipase  H/O adenomatous polyp of colon - past due for repeat colonoscopy with Dr. Perry.  Advised to contact GI to schedule.   Due for CPE--schedule with [facility]  CBC, c-met, lipase To come fasting for his CPE (lipids due). If abdominal pain persists/worsens, may need imaging. Past due for colonoscopy, to schedule with GI.  I spent 35 minutes dedicated to the care of this patient, including pre-visit review of records, face to face time, post-visit ordering of testing and documentation.   Eat a bland diet. Stay well hydrated. Try and eat a high fiber diet, in general. Consider trying simethicone when you have any abdominal discomfort (Gas-X). I suggest trying a 2 week trial of a probiotic (ie Align) to see if this helps get your stomach back to normal.  You are past due for repeat colonoscopy, due to having had an adenomatous polyp on the colonoscopy in 01/2015.  5 year follow-up was recommended. Please contact Dr. Perry's office ([facility]) to schedule this. 

## 2021-06-02 LAB — CBC WITH DIFFERENTIAL/PLATELET
Basophils Absolute: 0 10*3/uL (ref 0.0–0.2)
Basos: 0 %
EOS (ABSOLUTE): 0.1 10*3/uL (ref 0.0–0.4)
Eos: 1 %
Hematocrit: 39.8 % (ref 37.5–51.0)
Hemoglobin: 13.6 g/dL (ref 13.0–17.7)
Immature Grans (Abs): 0 10*3/uL (ref 0.0–0.1)
Immature Granulocytes: 0 %
Lymphocytes Absolute: 1.4 10*3/uL (ref 0.7–3.1)
Lymphs: 19 %
MCH: 29.8 pg (ref 26.6–33.0)
MCHC: 34.2 g/dL (ref 31.5–35.7)
MCV: 87 fL (ref 79–97)
Monocytes Absolute: 0.5 10*3/uL (ref 0.1–0.9)
Monocytes: 7 %
Neutrophils Absolute: 5.4 10*3/uL (ref 1.4–7.0)
Neutrophils: 73 %
Platelets: 247 10*3/uL (ref 150–450)
RBC: 4.57 x10E6/uL (ref 4.14–5.80)
RDW: 12.7 % (ref 11.6–15.4)
WBC: 7.4 10*3/uL (ref 3.4–10.8)

## 2021-06-02 LAB — COMPREHENSIVE METABOLIC PANEL
ALT: 21 IU/L (ref 0–44)
AST: 20 IU/L (ref 0–40)
Albumin/Globulin Ratio: 2 (ref 1.2–2.2)
Albumin: 5.1 g/dL — ABNORMAL HIGH (ref 3.8–4.9)
Alkaline Phosphatase: 50 IU/L (ref 44–121)
BUN/Creatinine Ratio: 18 (ref 9–20)
BUN: 18 mg/dL (ref 6–24)
Bilirubin Total: 0.8 mg/dL (ref 0.0–1.2)
CO2: 23 mmol/L (ref 20–29)
Calcium: 9.4 mg/dL (ref 8.7–10.2)
Chloride: 102 mmol/L (ref 96–106)
Creatinine, Ser: 0.98 mg/dL (ref 0.76–1.27)
Globulin, Total: 2.5 g/dL (ref 1.5–4.5)
Glucose: 94 mg/dL (ref 65–99)
Potassium: 4.5 mmol/L (ref 3.5–5.2)
Sodium: 140 mmol/L (ref 134–144)
Total Protein: 7.6 g/dL (ref 6.0–8.5)
eGFR: 89 mL/min/{1.73_m2} (ref >59)

## 2021-06-02 LAB — LIPASE: Lipase: 32 U/L (ref 13–78)

## 2021-06-05 ENCOUNTER — Other Ambulatory Visit: Payer: Self-pay | Admitting: Family Medicine

## 2021-06-05 DIAGNOSIS — E785 Hyperlipidemia, unspecified: Secondary | ICD-10-CM

## 2021-06-21 ENCOUNTER — Ambulatory Visit (INDEPENDENT_AMBULATORY_CARE_PROVIDER_SITE_OTHER): Payer: BC Managed Care – PPO | Admitting: Family Medicine

## 2021-06-21 ENCOUNTER — Other Ambulatory Visit: Payer: Self-pay

## 2021-06-21 VITALS — BP 110/66 | HR 68 | Temp 98.1°F | Ht 70.0 in | Wt 190.0 lb

## 2021-06-21 DIAGNOSIS — R109 Unspecified abdominal pain: Secondary | ICD-10-CM | POA: Diagnosis not present

## 2021-06-21 MED ORDER — HYOSCYAMINE SULFATE ER 0.375 MG PO TB12
0.3750 mg | ORAL_TABLET | Freq: Two times a day (BID) | ORAL | 0 refills | Status: DC
Start: 2021-06-21 — End: 2021-09-01

## 2021-06-21 NOTE — Patient Instructions (Signed)
Take the pantoprazole twice per day.  Started on the new medicine as well twice per day and if after 2 weeks you are not any better call me and I will refer you to Dr. Henrene Pastor to get in sooner

## 2021-06-21 NOTE — Progress Notes (Signed)
   Subjective:    Patient ID: Mitchell Ruiz, male    DOB: [birth year], 60 y.o.   MRN: [id number]  HPI He is here for consult concerning continued difficulty with a 1 month history of stomachache that has been intermittent in nature.  He states that it usually occurs roughly an hour after eating and is described as slightly cramping in nature..  No nausea, vomiting but does state that his bowel habit pattern has slightly changed.  He also states that coffee can sometimes make this worse.  No satiety.  He has been using align and also uses Protonix.  He does have a history of colonic polyps and is scheduled for repeat colonoscopy in September.   Review of Systems     Objective:   Physical Exam Alert and in no distress.  Abdominal exam shows normal bowel sounds without masses or tenderness.       Assessment & Plan:  Abdominal pain, unspecified abdominal location - Plan: hyoscyamine (LEVBID) 0.375 MG 12 hr tablet He is to double his Protonix and to take the [place] for the next 2 weeks.  If he continues have difficulty, I will refer to GI for further evaluation. He proceeded to ask several questions concerning some of his symptoms.  I explained that the best that he should do would be to keep track of his symptoms and anything that makes it better or worse including foods, position.  I explained that at this point there is no definite reason behind this and if it does not go away we will get GI involved.  Over 30 minutes spent discussing all this information with him.

## 2021-07-14 ENCOUNTER — Telehealth: Payer: Self-pay | Admitting: Family Medicine

## 2021-08-10 ENCOUNTER — Other Ambulatory Visit: Payer: Self-pay | Admitting: Family Medicine

## 2021-08-10 DIAGNOSIS — E785 Hyperlipidemia, unspecified: Secondary | ICD-10-CM

## 2021-08-10 DIAGNOSIS — K219 Gastro-esophageal reflux disease without esophagitis: Secondary | ICD-10-CM

## 2021-08-15 ENCOUNTER — Other Ambulatory Visit: Payer: Self-pay

## 2021-08-15 ENCOUNTER — Ambulatory Visit (INDEPENDENT_AMBULATORY_CARE_PROVIDER_SITE_OTHER): Payer: BC Managed Care – PPO | Admitting: Family Medicine

## 2021-08-15 VITALS — BP 128/84 | HR 68 | Temp 98.5°F | Wt 186.8 lb

## 2021-08-15 DIAGNOSIS — R109 Unspecified abdominal pain: Secondary | ICD-10-CM | POA: Diagnosis not present

## 2021-08-15 NOTE — Progress Notes (Signed)
   Subjective:    Patient ID: Mitchell Ruiz, male    DOB: June 04, 1961, 60 y.o.   MRN: [id number]  HPI He is here for evaluation of continued difficulty with intermittent abdominal pain.  He was seen previously and took double dose of Protonix as well as Levbid with no relief of his symptoms.  He describes is that intermittent aching sensation in his abdominal area and occasionally into the left flank area.  He now thinks that he might be constipated.  His weight is down slightly.  He cannot relate this to any particular foods specifically milk or milk products greasy foods.  Review of the record states that if he did not improve, I will send him for to see GI.  He does have an appointment for a colonoscopy on the 22nd.   Review of Systems     Objective:   Physical Exam Alert and in no distress.  Abdominal exam shows no masses or tenderness with decreased bowel sounds.       Assessment & Plan:  Abdominal pain, unspecified abdominal location - Plan: Ambulatory referral to Gastroenterology I sent an email to Dr. Henrene Pastor concerning the fact that he has a colonoscopy scheduled and the possibility of doing endoscopy at the same time.  The blood work done prior to this was nondiagnostic.  I spent a great deal of time talking to him.  He feels that he should have further evaluation including a scan.  I explained that that might possibly need to be done but the first thing would be to get gastroenterology involved and possibly get the endoscopy at the same time of the colonoscopy.  Explained that since its intermittent in nature and speaks against this being cancer.  Over 30 minutes spent discussing all these issues with him.

## 2021-09-01 ENCOUNTER — Ambulatory Visit (AMBULATORY_SURGERY_CENTER): Payer: Self-pay | Admitting: *Deleted

## 2021-09-01 ENCOUNTER — Other Ambulatory Visit: Payer: Self-pay

## 2021-09-01 VITALS — Ht 70.0 in | Wt 189.0 lb

## 2021-09-01 DIAGNOSIS — Z86010 Personal history of colonic polyps: Secondary | ICD-10-CM

## 2021-09-01 MED ORDER — PEG-KCL-NACL-NASULF-NA ASC-C 100 G PO SOLR: 1.0000 | Freq: Once | ORAL | 0 refills | Status: AC

## 2021-09-01 NOTE — Progress Notes (Signed)
Patient is here in-person for PV. Patient denies any allergies to eggs or soy. Patient denies any problems with anesthesia/sedation. Patient is not on any oxygen at home. Patient is not taking any diet/weight loss medications or blood thinners. Patient is aware of our care-partner policy and 0000000 safety protocol.   EMMI education assigned to the patient for the procedure, sent to [place].   Patient is COVID-19 vaccinated. Office visit made with Dr.Perry per pt's request-pt wants to proceed with colonoscopy as scheduled.

## 2021-09-15 ENCOUNTER — Other Ambulatory Visit: Payer: Self-pay

## 2021-09-15 ENCOUNTER — Encounter: Payer: Self-pay | Admitting: Internal Medicine

## 2021-09-15 ENCOUNTER — Ambulatory Visit (AMBULATORY_SURGERY_CENTER): Payer: BC Managed Care – PPO | Admitting: Internal Medicine

## 2021-09-15 VITALS — BP 91/63 | HR 68 | Temp 98.9°F | Resp 15 | Ht 70.0 in | Wt 189.0 lb

## 2021-09-15 DIAGNOSIS — D125 Benign neoplasm of sigmoid colon: Secondary | ICD-10-CM

## 2021-09-15 DIAGNOSIS — Z86010 Personal history of colonic polyps: Secondary | ICD-10-CM

## 2021-09-15 DIAGNOSIS — Z1211 Encounter for screening for malignant neoplasm of colon: Secondary | ICD-10-CM | POA: Diagnosis not present

## 2021-09-15 MED ORDER — SODIUM CHLORIDE 0.9 % IV SOLN: 500.0000 mL | Freq: Once | INTRAVENOUS | Status: DC

## 2021-09-15 NOTE — Op Note (Signed)
[facility] Patient Name: Mitchell Ruiz Procedure Date: 09/15/2021 2:39 PM MRN: [id number] Endoscopist: Docia Chuck. Mitchell Ruiz , MD Age: 60 Referring MD:  Date of Birth: 1961/10/17 Gender: Male Account #: [id number] Procedure:                Colonoscopy with cold snare polypectomy x 1 Indications:              High risk colon cancer surveillance: Personal                            history of non-advanced adenoma. Previous                            examination 2016. NOTE: Patient reports some                            transient problems with left-sided abdominal                            discomfort and change in bowel habits. Improved Medicines:                Monitored Anesthesia Care Procedure:                Pre-Anesthesia Assessment:                           - Prior to the procedure, a History and Physical                            was performed, and patient medications and                            allergies were reviewed. The patient's tolerance of                            previous anesthesia was also reviewed. The risks                            and benefits of the procedure and the sedation                            options and risks were discussed with the patient.                            All questions were answered, and informed consent                            was obtained. Prior Anticoagulants: The patient has                            taken no previous anticoagulant or antiplatelet                            agents. ASA Grade Assessment: II - A patient with  mild systemic disease. After reviewing the risks                            and benefits, the patient was deemed in                            satisfactory condition to undergo the procedure.                           After obtaining informed consent, the colonoscope                            was passed under direct vision. Throughout the                             procedure, the patient's blood pressure, pulse, and                            oxygen saturations were monitored continuously. The                            CF [id number] #[id number] was introduced through the anus                            and advanced to the the cecum, identified by                            appendiceal orifice and ileocecal valve. The                            ileocecal valve, appendiceal orifice, and rectum                            were photographed. The quality of the bowel                            preparation was excellent. The colonoscopy was                            performed without difficulty. The patient tolerated                            the procedure well. The bowel preparation used was                            SUPREP via split dose instruction. Scope In: 2:47:22 PM Scope Out: 3:00:04 PM Scope Withdrawal Time: 0 hours 7 minutes 5 seconds  Total Procedure Duration: 0 hours 12 minutes 42 seconds  Findings:                 A 4 mm polyp was found in the sigmoid colon. The                            polyp was removed with a cold  snare. Resection and                            retrieval were complete.                           Multiple diverticula were found in the sigmoid                            colon.                           Internal hemorrhoids were found during retroflexion.                           The exam was otherwise without abnormality on                            direct and retroflexion views. Complications:            No immediate complications. Estimated blood loss:                            None. Estimated Blood Loss:     Estimated blood loss: none. Impression:               - One 4 mm polyp in the sigmoid colon, removed with                            a cold snare. Resected and retrieved.                           - Diverticulosis in the sigmoid colon.                           - Internal hemorrhoids.                           - The  examination was otherwise normal on direct                            and retroflexion views.                           - Transient left-sided discomfort may be related to                            diverticular disease Recommendation:           - Repeat colonoscopy in 7 years for surveillance.                           - Patient has a contact number available for                            emergencies. The signs and symptoms of potential  delayed complications were discussed with the                            patient. Return to normal activities tomorrow.                            Written discharge instructions were provided to the                            patient.                           - Resume previous diet.                           - Continue present medications.                           - Await pathology results.                           - Fiber supplementation with Citrucel 1 or 2                            tablespoons daily                           - Keep office follow-up as you have already                            scheduled Kilo Eshelman N. Mitchell Pastor, MD 09/15/2021 3:06:56 PM This report has been signed electronically.

## 2021-09-15 NOTE — Progress Notes (Signed)
HISTORY OF PRESENT ILLNESS:  Mitchell Ruiz is a 60 y.o. male who presents today for surveillance colonoscopy.  Previous examination 2016 with tubular adenoma.  No active GI complaints.  No active general medical problems.  REVIEW OF SYSTEMS:  All non-GI ROS negative.  Past Medical History:  Diagnosis Date   Chest discomfort    Chronic headaches    GERD (gastroesophageal reflux disease)    High cholesterol    Seizure ([place])    15 years ago; seizure only x2 per pt    Past Surgical History:  Procedure Laterality Date   ANKLE SURGERY Left    COLONOSCOPY  02/16/2015   FINGER SURGERY Left    nerve damage, index finger   MENISCUS REPAIR Left    UPPER GASTROINTESTINAL ENDOSCOPY  02/16/2015    Social History Mitchell Ruiz  reports that he has never smoked. He has never used smokeless tobacco. He reports that he does not currently use alcohol after a past usage of about 6.0 standard drinks per week. He reports that he does not use drugs.  family history includes Alcoholism in his maternal grandfather; Diabetes in his maternal grandfather; Heart failure in his maternal grandmother; Multiple sclerosis in his brother.  No Known Allergies     PHYSICAL EXAMINATION:  Vital signs: BP 128/73   Pulse 79   Temp 98.9 F (37.2 C) (Temporal)   Ht 5\' 10"  (1.778 m)   Wt 189 lb (85.7 kg)   SpO2 99%   BMI 27.12 kg/m  General: Well-developed, well-nourished, no acute distress HEENT: Sclerae are anicteric, conjunctiva pink. Oral mucosa intact Lungs: Clear Heart: Regular Abdomen: soft, nontender, nondistended, no obvious ascites, no peritoneal signs, normal bowel sounds. No organomegaly. Extremities: No edema Psychiatric: alert and oriented x3. Cooperative     ASSESSMENT:  1.  History of adenomatous colon polyps.  Due for surveillance   PLAN:   1.  Surveillance colonoscopy

## 2021-09-15 NOTE — Progress Notes (Signed)
VS-CW  Pt's states no medical or surgical changes since previsit or office visit.  

## 2021-09-15 NOTE — Progress Notes (Signed)
PT taken to PACU. Monitors in place. VSS. Report given to RN. 

## 2021-09-15 NOTE — Progress Notes (Signed)
Called to room to assist during endoscopic procedure.  Patient ID and intended procedure confirmed with present staff. Received instructions for my participation in the procedure from the performing physician.  

## 2021-09-15 NOTE — Patient Instructions (Signed)
Handout given:  diverticulosis, polyps, high fiber diet Resume previous diet Continue current medications Await pathology results Take fiber supplement of citrucel 1-2 tablespoons daily  YOU HAD AN ENDOSCOPIC PROCEDURE TODAY AT [facility]:   Refer to the procedure report that was given to you for any specific questions about what was found during the examination.  If the procedure report does not answer your questions, please call your gastroenterologist to clarify.  If you requested that your care partner not be given the details of your procedure findings, then the procedure report has been included in a sealed envelope for you to review at your convenience later.  YOU SHOULD EXPECT: Some feelings of bloating in the abdomen. Passage of more gas than usual.  Walking can help get rid of the air that was put into your GI tract during the procedure and reduce the bloating. If you had a lower endoscopy (such as a colonoscopy or flexible sigmoidoscopy) you may notice spotting of blood in your stool or on the toilet paper. If you underwent a bowel prep for your procedure, you may not have a normal bowel movement for a few days.  Please Note:  You might notice some irritation and congestion in your nose or some drainage.  This is from the oxygen used during your procedure.  There is no need for concern and it should clear up in a day or so.  SYMPTOMS TO REPORT IMMEDIATELY:  Following lower endoscopy (colonoscopy or flexible sigmoidoscopy):  Excessive amounts of blood in the stool  Significant tenderness or worsening of abdominal pains  Swelling of the abdomen that is new, acute  Fever of 100F or higher  For urgent or emergent issues, a gastroenterologist can be reached at any hour by calling [phone]. Do not use MyChart messaging for urgent concerns.   DIET:  We do recommend a small meal at first, but then you may proceed to your regular diet.  Drink plenty of fluids but you  should avoid alcoholic beverages for 24 hours.  ACTIVITY:  You should plan to take it easy for the rest of today and you should NOT DRIVE or use heavy machinery until tomorrow (because of the sedation medicines used during the test).    FOLLOW UP: Our staff will call the number listed on your records 48-72 hours following your procedure to check on you and address any questions or concerns that you may have regarding the information given to you following your procedure. If we do not reach you, we will leave a message.  We will attempt to reach you two times.  During this call, we will ask if you have developed any symptoms of COVID 19. If you develop any symptoms (ie: fever, flu-like symptoms, shortness of breath, cough etc.) before then, please call [phone].  If you test positive for Covid 19 in the 2 weeks post procedure, please call and report this information to [place].    If any biopsies were taken you will be contacted by phone or by letter within the next 1-3 weeks.  Please call us at [phone] if you have not heard about the biopsies in 3 weeks.   SIGNATURES/CONFIDENTIALITY: You and/or your care partner have signed paperwork which will be entered into your electronic medical record.  These signatures attest to the fact that that the information above on your After Visit Summary has been reviewed and is understood.  Full responsibility of the confidentiality of this discharge information lies with  you and/or your care-partner.

## 2021-09-19 ENCOUNTER — Telehealth: Payer: Self-pay | Admitting: *Deleted

## 2021-09-19 ENCOUNTER — Telehealth: Payer: Self-pay

## 2021-09-19 NOTE — Telephone Encounter (Signed)
  Follow up Call-  Call back number 09/15/2021  Post procedure Call Back phone  # [phone]  Permission to leave phone message Yes  Some recent data might be hidden     Patient questions:  Do you have a fever, pain , or abdominal swelling? No. Pain Score  0 *  Have you tolerated food without any problems? Yes.    Have you been able to return to your normal activities? Yes.    Do you have any questions about your discharge instructions: Diet   No. Medications  No. Follow up visit  No.  Do you have questions or concerns about your Care? No.  Actions: * If pain score is 4 or above: No action needed, pain <4.  Have you developed a fever since your procedure? no  2.   Have you had an respiratory symptoms (SOB or cough) since your procedure? no  3.   Have you tested positive for COVID 19 since your procedure no  4.   Have you had any family members/close contacts diagnosed with the COVID 19 since your procedure?  no   If yes to any of these questions please route to Joylene John, RN and Joella Prince, RN

## 2021-09-19 NOTE — Telephone Encounter (Signed)
Left message on answering machine. 

## 2021-09-21 ENCOUNTER — Encounter: Payer: Self-pay | Admitting: Internal Medicine

## 2021-10-05 ENCOUNTER — Ambulatory Visit (INDEPENDENT_AMBULATORY_CARE_PROVIDER_SITE_OTHER): Payer: BC Managed Care – PPO | Admitting: Internal Medicine

## 2021-10-05 ENCOUNTER — Encounter: Payer: Self-pay | Admitting: Internal Medicine

## 2021-10-05 VITALS — BP 110/80 | HR 65 | Ht 70.0 in | Wt 191.0 lb

## 2021-10-05 DIAGNOSIS — R198 Other specified symptoms and signs involving the digestive system and abdomen: Secondary | ICD-10-CM

## 2021-10-05 DIAGNOSIS — K5790 Diverticulosis of intestine, part unspecified, without perforation or abscess without bleeding: Secondary | ICD-10-CM

## 2021-10-05 DIAGNOSIS — R1030 Lower abdominal pain, unspecified: Secondary | ICD-10-CM

## 2021-10-05 NOTE — Progress Notes (Signed)
HISTORY OF PRESENT ILLNESS:  Mitchell Ruiz is a 60 y.o. male who presents today for follow-up regarding problems with recurrent lower abdominal pain and alternating bowel habits.  This was particular problematic this past summer.  He was seen by his PCP on several occasions.  Routine blood work including comprehensive metabolic panel, and CBC were normal.  He does have a history of adenomatous colon polyps.  He underwent colonoscopy September 15, 2021.  He was found to have a diminutive tubular adenoma which was removed.  Also sigmoid diverticulosis.  Follow-up in 7 years recommended.  Fiber supplementation with Citrucel recommended.  This office appointment previously made has been.  Patient tells me that he is doing better overall in terms of pain in bowel habits, though still has some issues.  He is concerned of more ominous diagnoses.  He does describe some lower back and right flank discomfort.  No urinary complaints.  He does take pantoprazole for GERD.  No active GERD symptoms.  REVIEW OF SYSTEMS:  All non-GI ROS negative unless otherwise stated in the HPI. Past Medical History:  Diagnosis Date   Chest discomfort    Chronic headaches    GERD (gastroesophageal reflux disease)    High cholesterol    Seizure ([place])    15 years ago; seizure only x2 per pt    Past Surgical History:  Procedure Laterality Date   ANKLE SURGERY Left    COLONOSCOPY  02/16/2015   FINGER SURGERY Left    nerve damage, index finger   MENISCUS REPAIR Left    UPPER GASTROINTESTINAL ENDOSCOPY  02/16/2015    Social History Mitchell Ruiz  reports that he has never smoked. He has never used smokeless tobacco. He reports that he does not currently use alcohol after a past usage of about 6.0 standard drinks per week. He reports that he does not use drugs.  family history includes Alcoholism in his maternal grandfather; Diabetes in his maternal grandfather; Heart failure in his maternal grandmother; Multiple  sclerosis in his brother.  No Known Allergies     PHYSICAL EXAMINATION: Vital signs: BP 110/80   Pulse 65   Ht 5\' 10"  (1.778 m)   Wt 191 lb (86.6 kg)   BMI 27.41 kg/m   Constitutional: generally well-appearing, no acute distress Psychiatric: alert and oriented x3, cooperative Eyes: extraocular movements intact, anicteric, conjunctiva pink Mouth: oral pharynx moist, no lesions Neck: supple no lymphadenopathy Cardiovascular: heart regular rate and rhythm, no murmur Lungs: clear to auscultation bilaterally Abdomen: soft, nontender, nondistended, no obvious ascites, no peritoneal signs, normal bowel sounds, no organomegaly Rectal: Omitted Extremities: no clubbing, cyanosis, or lower extremity edema bilaterally Skin: no lesions on visible extremities Neuro: No focal deficits.  Cranial nerves intact  ASSESSMENT:  1.  Recurrent lower abdominal pain. 2.  Diverticulosis 3.  Alternating bowel habits 4.  History of adenomatous colon polyps.  Surveillance up-to-date 5.  GERD.  Asymptomatic on PPI   PLAN:  1.  Citrucel 2 tablespoons daily for irregular bowel habits. 2.  Schedule contrast-enhanced CT scan of the abdomen pelvis to evaluate recurrent lower abdominal pain.  We will contact the patient after the results are available. 3.  Reflux precautions 4.  Continue PPI 5.  Surveillance colonoscopy around September 2029 6.  Routine GI office follow-up 1 year.  Sooner if needed pending the results of the above and is clinical progress.

## 2021-10-05 NOTE — Patient Instructions (Signed)
If you are age 60 or older, your body mass index should be between 23-30. Your Body mass index is 27.41 kg/m. If this is out of the aforementioned range listed, please consider follow up with your Primary Care Provider.  If you are age 59 or younger, your body mass index should be between 19-25. Your Body mass index is 27.41 kg/m. If this is out of the aformentioned range listed, please consider follow up with your Primary Care Provider.   __________________________________________________________  The [facility] providers would like to encourage you to use [facility] to communicate with providers for non-urgent requests or questions.  Due to long hold times on the telephone, sending your provider a message by [facility] may be a faster and more efficient way to get a response.  Please allow 48 business hours for a response.  Please remember that this is for non-urgent requests.   Take 2 tablespoons of Citrucel daily  You have been scheduled for a CT scan of the abdomen and pelvis at [facility] ([address]---this is in the same building as [employer]).   You are scheduled on 10/12/2021 at 2:30pm. You should arrive 15 minutes prior to your appointment time for registration. Please follow the written instructions below on the day of your exam:  WARNING: IF YOU ARE ALLERGIC TO IODINE/X-RAY DYE, PLEASE NOTIFY RADIOLOGY IMMEDIATELY AT [phone]! YOU WILL BE GIVEN A 13 HOUR PREMEDICATION PREP.  1) Do not eat anything after 10:30am (4 hours prior to your test) 2) You have been given 2 bottles of oral contrast to drink. The solution may taste better if refrigerated, but do NOT add ice or any other liquid to this solution. Shake well before drinking.    Drink 1 bottle of contrast @ 12:30pm (2 hours prior to your exam)  Drink 1 bottle of contrast @ 1:30pm (1 hour prior to your exam)  You may take any medications as prescribed with a small amount of water, if necessary. If  you take any of the following medications: METFORMIN, GLUCOPHAGE, GLUCOVANCE, AVANDAMET, RIOMET, FORTAMET, [place] MET, JANUMET, GLUMETZA or METAGLIP, you MAY be asked to HOLD this medication 48 hours AFTER the exam.  The purpose of you drinking the oral contrast is to aid in the visualization of your intestinal tract. The contrast solution may cause some diarrhea. Depending on your individual set of symptoms, you may also receive an intravenous injection of x-ray contrast/dye. Plan on being at [facility] for 30 minutes or longer, depending on the type of exam you are having performed.  This test typically takes 30-45 minutes to complete.  If you have any questions regarding your exam or if you need to reschedule, you may call the CT department at [phone] between the hours of 8:00 am and 5:00 pm, Monday-Friday.  ________________________________________________________________________

## 2021-10-12 ENCOUNTER — Other Ambulatory Visit: Payer: Self-pay

## 2021-10-12 ENCOUNTER — Ambulatory Visit (INDEPENDENT_AMBULATORY_CARE_PROVIDER_SITE_OTHER)
Admission: RE | Admit: 2021-10-12 | Discharge: 2021-10-12 | Disposition: A | Discharge status: Home / Self Care | Payer: BC Managed Care – PPO | Source: Ambulatory Visit | Attending: Internal Medicine | Admitting: Internal Medicine

## 2021-10-12 DIAGNOSIS — K429 Umbilical hernia without obstruction or gangrene: Secondary | ICD-10-CM | POA: Diagnosis not present

## 2021-10-12 DIAGNOSIS — N281 Cyst of kidney, acquired: Secondary | ICD-10-CM | POA: Diagnosis not present

## 2021-10-12 DIAGNOSIS — R1030 Lower abdominal pain, unspecified: Secondary | ICD-10-CM | POA: Diagnosis not present

## 2021-10-12 DIAGNOSIS — K5790 Diverticulosis of intestine, part unspecified, without perforation or abscess without bleeding: Secondary | ICD-10-CM | POA: Diagnosis not present

## 2021-10-12 DIAGNOSIS — K5730 Diverticulosis of large intestine without perforation or abscess without bleeding: Secondary | ICD-10-CM | POA: Diagnosis not present

## 2021-10-12 DIAGNOSIS — K769 Liver disease, unspecified: Secondary | ICD-10-CM | POA: Diagnosis not present

## 2021-10-12 MED ORDER — IOHEXOL 350 MG/ML SOLN
80.0000 mL | Freq: Once | INTRAVENOUS | Status: AC | PRN
  Administered 2021-10-12: 80 mL via INTRAVENOUS

## 2021-10-14 ENCOUNTER — Telehealth: Payer: Self-pay

## 2021-10-14 NOTE — Telephone Encounter (Signed)
Left message for patient to please call back. 

## 2021-10-14 NOTE — Telephone Encounter (Signed)
-----   Message from Irene Shipper, MD sent at 10/13/2021 11:54 AM EDT ----- Please let the patient know the following regarding his CT scan: 1.  He has a umbilical (bellybutton area) hernia with some mild inflammation.  This may cause abdominal pain.  If this is bothering him, please refer to general surgery.  If not, just observe. 2.  He had an abnormality of the spine in the mid to lower back.  This may explain some of the back pain that he mentioned to me.  He should return to his PCP regarding this issue if his back is still bothering him. 3.  CT was otherwise okay.  Nothing further from GI perspective at this point.  Thanks Dr. Henrene Pastor

## 2021-11-09 ENCOUNTER — Other Ambulatory Visit: Payer: Self-pay | Admitting: Family Medicine

## 2021-11-09 DIAGNOSIS — E785 Hyperlipidemia, unspecified: Secondary | ICD-10-CM

## 2021-11-21 ENCOUNTER — Telehealth: Payer: Self-pay

## 2021-11-21 ENCOUNTER — Other Ambulatory Visit: Payer: Self-pay

## 2021-11-21 DIAGNOSIS — E785 Hyperlipidemia, unspecified: Secondary | ICD-10-CM

## 2021-11-21 MED ORDER — ROSUVASTATIN CALCIUM 10 MG PO TABS
ORAL_TABLET | ORAL | 0 refills | Status: DC
Start: 2021-11-21 — End: 2022-02-07

## 2021-11-21 NOTE — Telephone Encounter (Signed)
Recv'd fax from [place] [facility] that insurance requires 90 days for Rosuvastatin

## 2021-11-21 NOTE — Telephone Encounter (Signed)
Done KH 

## 2021-11-28 ENCOUNTER — Other Ambulatory Visit: Payer: Self-pay | Admitting: Family Medicine

## 2021-12-09 ENCOUNTER — Encounter: Payer: BC Managed Care – PPO | Admitting: Family Medicine

## 2022-02-07 ENCOUNTER — Other Ambulatory Visit: Payer: Self-pay

## 2022-02-07 ENCOUNTER — Telehealth: Payer: Self-pay | Admitting: Family Medicine

## 2022-02-07 DIAGNOSIS — E785 Hyperlipidemia, unspecified: Secondary | ICD-10-CM

## 2022-02-07 MED ORDER — ROSUVASTATIN CALCIUM 10 MG PO TABS: ORAL_TABLET | ORAL | 0 refills | Status: DC

## 2022-02-07 NOTE — Telephone Encounter (Signed)
[facility] sent request for rosuvastatin please send to the [place], [place]

## 2022-02-07 NOTE — Telephone Encounter (Signed)
Done and pt was advised of the need for an appointment. Mitchell Ruiz

## 2022-04-12 ENCOUNTER — Other Ambulatory Visit: Payer: Self-pay | Admitting: Medical

## 2022-04-12 ENCOUNTER — Other Ambulatory Visit: Payer: Self-pay | Admitting: Family Medicine

## 2022-04-12 DIAGNOSIS — E785 Hyperlipidemia, unspecified: Secondary | ICD-10-CM

## 2022-04-12 NOTE — Telephone Encounter (Signed)
Pt is scheduling an appt with adam ?

## 2022-04-26 ENCOUNTER — Ambulatory Visit
Admission: RE | Admit: 2022-04-26 | Discharge: 2022-04-26 | Disposition: A | Discharge status: Home / Self Care | Payer: BC Managed Care – PPO | Source: Ambulatory Visit | Attending: Family Medicine | Admitting: Family Medicine

## 2022-04-26 ENCOUNTER — Encounter: Payer: Self-pay | Admitting: Family Medicine

## 2022-04-26 ENCOUNTER — Ambulatory Visit (INDEPENDENT_AMBULATORY_CARE_PROVIDER_SITE_OTHER): Payer: BC Managed Care – PPO | Admitting: Family Medicine

## 2022-04-26 VITALS — BP 110/70 | HR 74 | Temp 98.1°F | Ht 70.5 in | Wt 194.8 lb

## 2022-04-26 DIAGNOSIS — Z23 Encounter for immunization: Secondary | ICD-10-CM

## 2022-04-26 DIAGNOSIS — L821 Other seborrheic keratosis: Secondary | ICD-10-CM

## 2022-04-26 DIAGNOSIS — G8929 Other chronic pain: Secondary | ICD-10-CM

## 2022-04-26 DIAGNOSIS — K219 Gastro-esophageal reflux disease without esophagitis: Secondary | ICD-10-CM

## 2022-04-26 DIAGNOSIS — M545 Low back pain, unspecified: Secondary | ICD-10-CM | POA: Diagnosis not present

## 2022-04-26 DIAGNOSIS — E785 Hyperlipidemia, unspecified: Secondary | ICD-10-CM | POA: Diagnosis not present

## 2022-04-26 DIAGNOSIS — Z86010 Personal history of colonic polyps: Secondary | ICD-10-CM

## 2022-04-26 DIAGNOSIS — Z0000 Encounter for general adult medical examination without abnormal findings: Secondary | ICD-10-CM

## 2022-04-26 DIAGNOSIS — H6123 Impacted cerumen, bilateral: Secondary | ICD-10-CM | POA: Diagnosis not present

## 2022-04-26 LAB — COMPREHENSIVE METABOLIC PANEL
ALT: 17 IU/L (ref 0–44)
AST: 16 IU/L (ref 0–40)
Albumin/Globulin Ratio: 1.9 (ref 1.2–2.2)
Albumin: 4.7 g/dL (ref 3.8–4.9)
Alkaline Phosphatase: 42 IU/L — ABNORMAL LOW (ref 44–121)
BUN/Creatinine Ratio: 17 (ref 10–24)
BUN: 16 mg/dL (ref 8–27)
Bilirubin Total: 0.3 mg/dL (ref 0.0–1.2)
CO2: 24 mmol/L (ref 20–29)
Calcium: 9.5 mg/dL (ref 8.6–10.2)
Chloride: 104 mmol/L (ref 96–106)
Creatinine, Ser: 0.94 mg/dL (ref 0.76–1.27)
Globulin, Total: 2.5 g/dL (ref 1.5–4.5)
Glucose: 102 mg/dL — ABNORMAL HIGH (ref 70–99)
Potassium: 4.8 mmol/L (ref 3.5–5.2)
Sodium: 141 mmol/L (ref 134–144)
Total Protein: 7.2 g/dL (ref 6.0–8.5)
eGFR: 93 mL/min/{1.73_m2} (ref >59)

## 2022-04-26 LAB — CBC WITH DIFFERENTIAL/PLATELET
Basophils Absolute: 0 10*3/uL (ref 0.0–0.2)
Basos: 1 %
EOS (ABSOLUTE): 0.1 10*3/uL (ref 0.0–0.4)
Eos: 2 %
Hematocrit: 39.3 % (ref 37.5–51.0)
Hemoglobin: 13.4 g/dL (ref 13.0–17.7)
Immature Grans (Abs): 0 10*3/uL (ref 0.0–0.1)
Immature Granulocytes: 0 %
Lymphocytes Absolute: 1.2 10*3/uL (ref 0.7–3.1)
Lymphs: 24 %
MCH: 29.4 pg (ref 26.6–33.0)
MCHC: 34.1 g/dL (ref 31.5–35.7)
MCV: 86 fL (ref 79–97)
Monocytes Absolute: 0.5 10*3/uL (ref 0.1–0.9)
Monocytes: 9 %
Neutrophils Absolute: 3.4 10*3/uL (ref 1.4–7.0)
Neutrophils: 64 %
Platelets: 252 10*3/uL (ref 150–450)
RBC: 4.56 x10E6/uL (ref 4.14–5.80)
RDW: 12.7 % (ref 11.6–15.4)
WBC: 5.2 10*3/uL (ref 3.4–10.8)

## 2022-04-26 LAB — LIPID PANEL
Chol/HDL Ratio: 3.7 ratio (ref 0.0–5.0)
Cholesterol, Total: 177 mg/dL (ref 100–199)
HDL: 48 mg/dL (ref >39)
LDL Chol Calc (NIH): 114 mg/dL — ABNORMAL HIGH (ref 0–99)
Triglycerides: 78 mg/dL (ref 0–149)
VLDL Cholesterol Cal: 15 mg/dL (ref 5–40)

## 2022-04-26 MED ORDER — PANTOPRAZOLE SODIUM 40 MG PO TBEC: DELAYED_RELEASE_TABLET | ORAL | 3 refills | Status: DC

## 2022-04-26 MED ORDER — ROSUVASTATIN CALCIUM 10 MG PO TABS: ORAL_TABLET | ORAL | 3 refills | Status: DC

## 2022-04-26 NOTE — Progress Notes (Signed)
? ?  Subjective:  ? ? Patient ID: Mitchell Ruiz, male    DOB: 07/05/1961, 61 y.o.   MRN: [id number] ? ?HPI ?He is here for complete examination.  He has had continued difficulty intermittently with back pain.  He notes that with certain positions the pain is made worse.  He does sleep a lot on the floor because it seems to be more comfortable for him.  He also states that his ears are plugged up and need to be irrigated.  He continues to do well on Protonix for his reflux symptoms.  He is also taking Crestor and having no difficulty with that.  Does have evidence of aortic atherosclerosis.  He also had a colonoscopy which did show an adenomatous polyp and will need to be scheduled for follow-up colonoscopy. ? ? ?Review of Systems  ?All other systems reviewed and are negative. ? ?   ?Objective:  ? Physical Exam ?Alert and in no distress. Tympanic membranes and canals are normal after cerumen was manually removed.Marland Kitchen Pharyngeal area is normal. Neck is supple without adenopathy or thyromegaly. Cardiac exam shows a regular sinus rhythm without murmurs or gallops. Lungs are clear to auscultation.  Exam of the skin does show several pigmented raised lesions.  He does plan to see dermatology to follow-up on that. ? ? ? ? ?   ?Assessment & Plan:  ?Routine general medical examination at a health care facility - Plan: CBC with Differential/Platelet, Comprehensive metabolic panel, Lipid panel ? ?Chronic midline low back pain without sciatica - Plan: DG Lumbar Spine Complete ? ?H/O adenomatous polyp of colon - Plan: CBC with Differential/Platelet, Comprehensive metabolic panel ? ?Gastroesophageal reflux disease, unspecified whether esophagitis present - Plan: CBC with Differential/Platelet, Comprehensive metabolic panel ? ?Seborrheic keratosis ? ?Hyperlipidemia, unspecified hyperlipidemia type - Plan: Lipid panel ? ?Need for shingles vaccine - Plan: Varicella-zoster vaccine IM ? ?Bilateral impacted cerumen ?Explained that if the  x-ray shows nothing of any major concern, referral to physical therapy would be appropriate.  Discussed the need for good back rehab program.  He will continue on his present medication regimen.  Shingles vaccine given.  He is to return for follow-up shot on that. ? ?

## 2022-04-27 NOTE — Addendum Note (Signed)
Addended by: Denita Lung on: 04/27/2022 09:06 AM ? ? Modules accepted: Orders ? ?

## 2022-05-02 DIAGNOSIS — L814 Other melanin hyperpigmentation: Secondary | ICD-10-CM | POA: Diagnosis not present

## 2022-05-02 DIAGNOSIS — D485 Neoplasm of uncertain behavior of skin: Secondary | ICD-10-CM | POA: Diagnosis not present

## 2022-05-02 DIAGNOSIS — C44612 Basal cell carcinoma of skin of right upper limb, including shoulder: Secondary | ICD-10-CM | POA: Diagnosis not present

## 2022-05-02 DIAGNOSIS — L570 Actinic keratosis: Secondary | ICD-10-CM | POA: Diagnosis not present

## 2022-05-02 DIAGNOSIS — L821 Other seborrheic keratosis: Secondary | ICD-10-CM | POA: Diagnosis not present

## 2022-05-02 DIAGNOSIS — L578 Other skin changes due to chronic exposure to nonionizing radiation: Secondary | ICD-10-CM | POA: Diagnosis not present

## 2022-05-02 DIAGNOSIS — D1801 Hemangioma of skin and subcutaneous tissue: Secondary | ICD-10-CM | POA: Diagnosis not present

## 2022-05-18 DIAGNOSIS — C44612 Basal cell carcinoma of skin of right upper limb, including shoulder: Secondary | ICD-10-CM | POA: Diagnosis not present

## 2022-06-12 IMAGING — CR DG LUMBAR SPINE COMPLETE 4+V
5 series · 5 of 5 positions shown · non-contrast
Comparison: CT abdomen pelvis 10/12/2021.

CLINICAL DATA: Chronic lower back pain for 8-9 months.

EXAM:
LUMBAR SPINE - COMPLETE 4+ VIEW

[w lumbar spine ap]
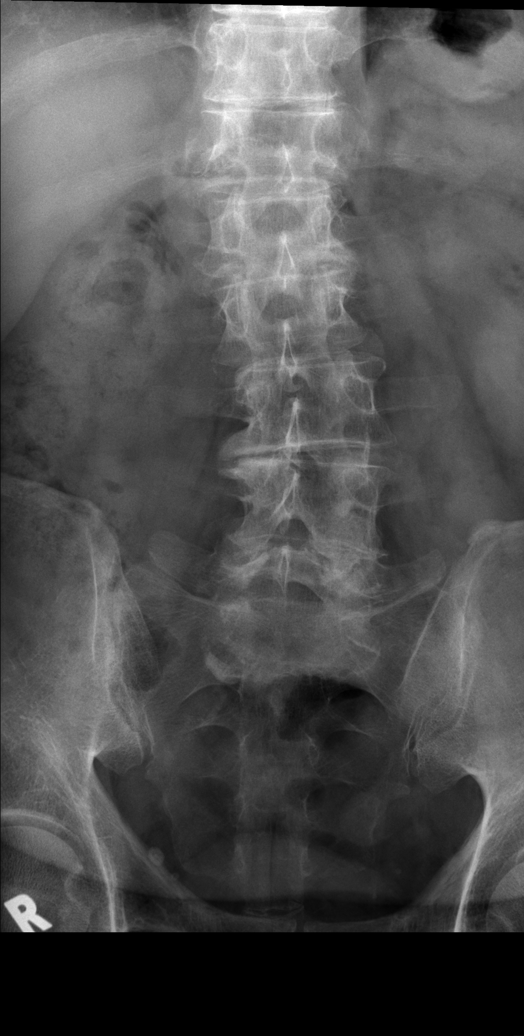

[w lumbar spine obl (1 of 2)]
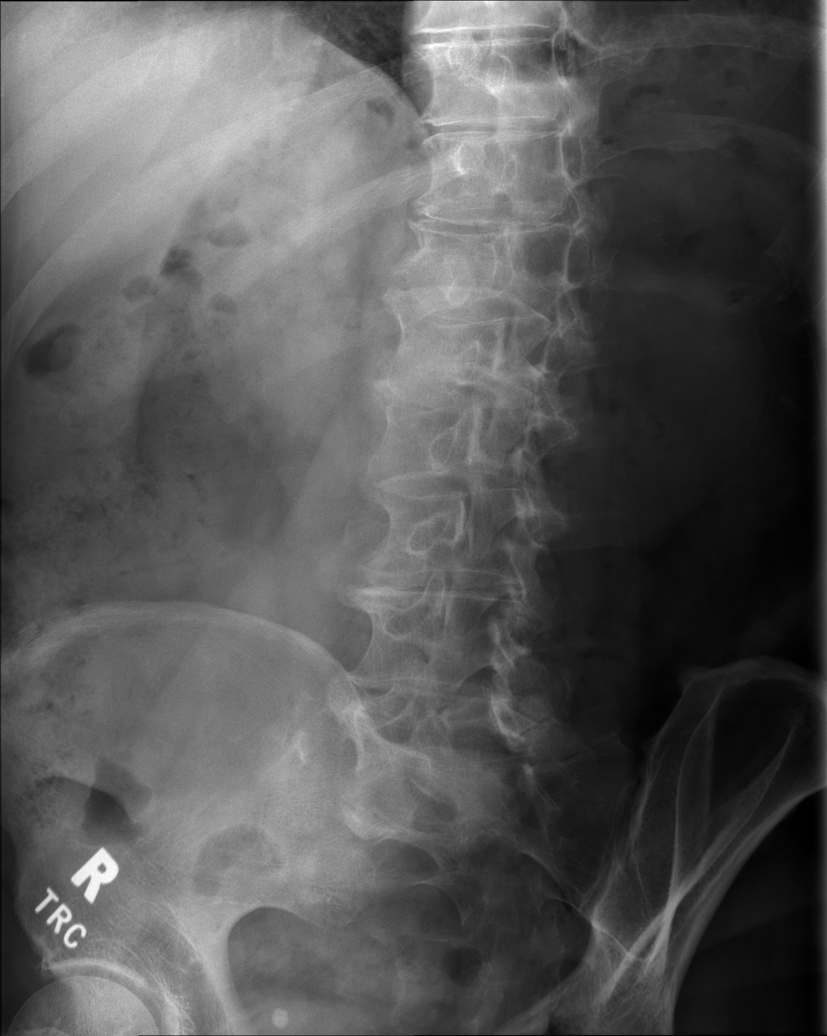

[w lumbar spine obl (2 of 2)]
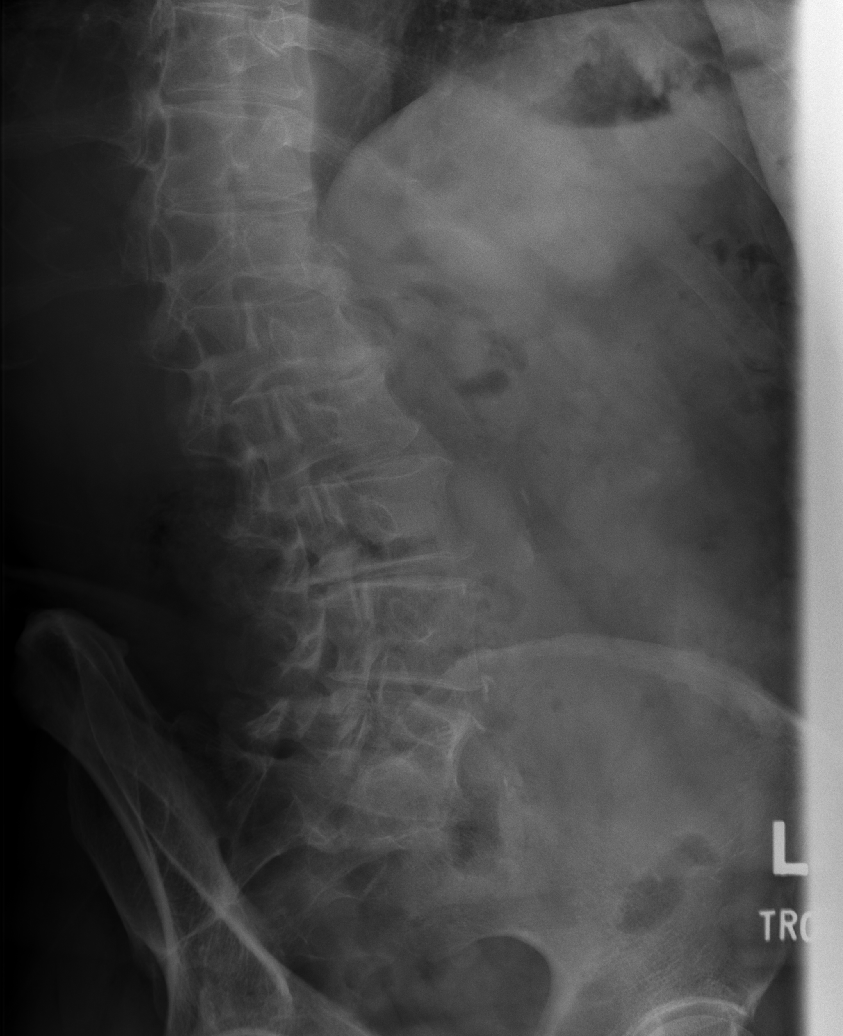

[w lumbar spine lat]
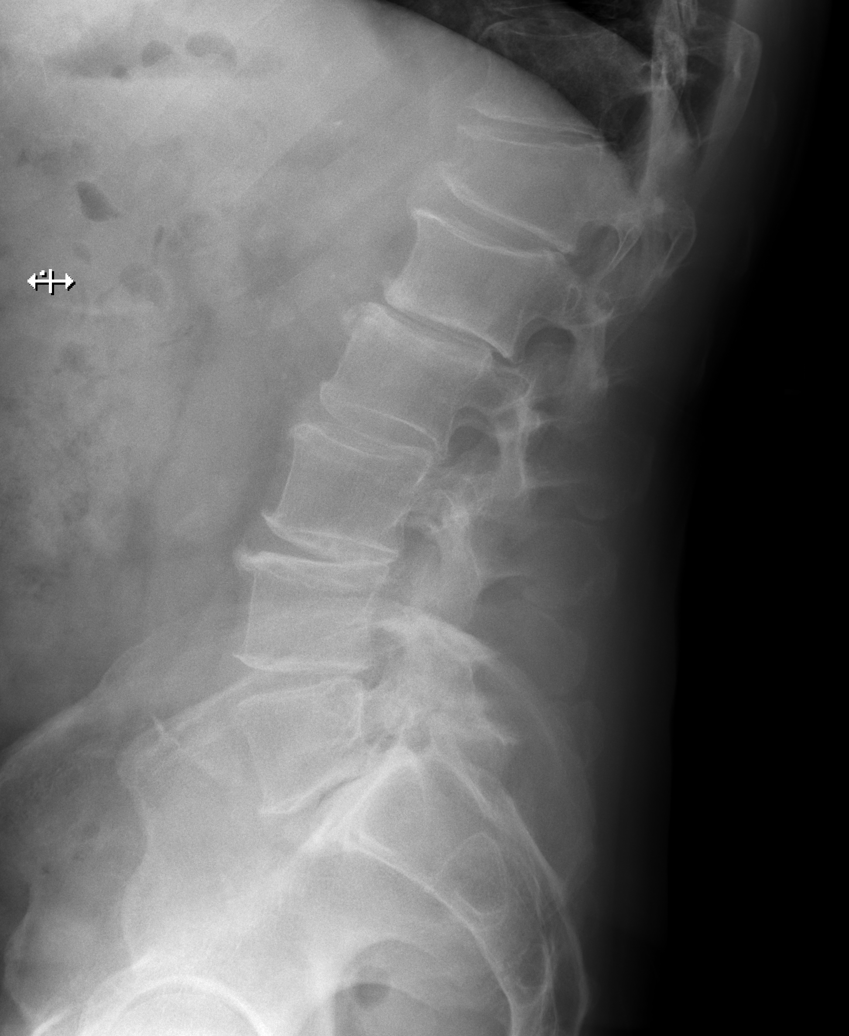

[w lumbar l-5 s-1 spot]
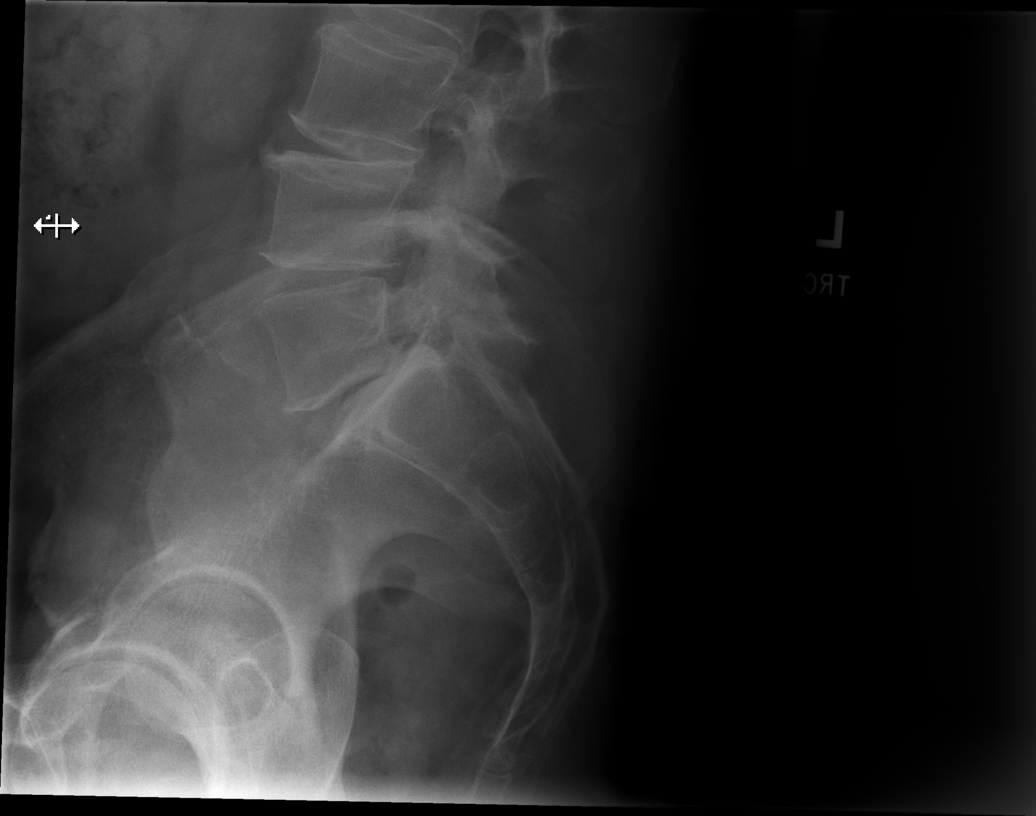

[5 of 5 positions shown; findings below may reference images not displayed]

FINDINGS: Grade 1 anterolisthesis of L5 on S1. Suggestion of L5 pars defects.
Multi level lower lumbar spine degenerative disc disease most
pronounced L1-2 and L3-4. Chronic height loss of the T12 vertebral
body.
IMPRESSION: Multilevel degenerative disc disease. Grade 1 anterolisthesis L5 on
S1.

## 2022-08-30 ENCOUNTER — Encounter: Payer: Self-pay | Admitting: Internal Medicine

## 2022-10-03 ENCOUNTER — Encounter: Payer: Self-pay | Admitting: Internal Medicine

## 2022-10-16 ENCOUNTER — Encounter: Payer: Self-pay | Admitting: Internal Medicine

## 2023-01-22 ENCOUNTER — Ambulatory Visit (INDEPENDENT_AMBULATORY_CARE_PROVIDER_SITE_OTHER): Payer: BC Managed Care – PPO | Admitting: Medical

## 2023-01-22 VITALS — BP 110/70 | HR 63 | Temp 97.6°F | Wt 191.4 lb

## 2023-01-22 DIAGNOSIS — R519 Headache, unspecified: Secondary | ICD-10-CM | POA: Diagnosis not present

## 2023-01-22 DIAGNOSIS — M542 Cervicalgia: Secondary | ICD-10-CM | POA: Diagnosis not present

## 2023-01-22 DIAGNOSIS — R29898 Other symptoms and signs involving the musculoskeletal system: Secondary | ICD-10-CM | POA: Diagnosis not present

## 2023-01-22 MED ORDER — IBUPROFEN 600 MG PO TABS: 600.0000 mg | ORAL_TABLET | Freq: Three times a day (TID) | ORAL | 0 refills | Status: DC | PRN

## 2023-01-22 MED ORDER — TIZANIDINE HCL 4 MG PO TABS: 4.0000 mg | ORAL_TABLET | Freq: Two times a day (BID) | ORAL | 0 refills | Status: DC | PRN

## 2023-01-22 NOTE — Progress Notes (Signed)
Subjective:  Mitchell Ruiz is a 62 y.o. male who presents for Chief Complaint  Patient presents with   Headache    Headache that is the back neck. Worse when laying down, been going on for the last 5 days.      Here for bad headache, back of neck.  Been having headache about 5 days.   Hurts to turn head to right or to bend head backwards into extension.   Prior to this has not been having head aches but he does have a history of cluster headaches in the remote past.  No recent injury or trauma.  Has had some stress at work.  Wife rubbed neck but that didn't help.  He denies nausea, vomiting, fever, no incontinence, no numbness or tingling in the arms or legs, no weakness, no slurred speech, no confusion, no vision or hearing change.   No other aggravating or relieving factors.    No other c/o.  Past Medical History:  Diagnosis Date   Chest discomfort    Chronic headaches    GERD (gastroesophageal reflux disease)    High cholesterol    Seizure ([place])    15 years ago; seizure only x2 per pt   Current Outpatient Medications on File Prior to Visit  Medication Sig Dispense Refill   pantoprazole (PROTONIX) 40 MG tablet TAKE 1 TABLET(40 MG) BY MOUTH DAILY 90 tablet 3   rosuvastatin (CRESTOR) 10 MG tablet TAKE 1 TABLET BY MOUTH DAILY 90 tablet 3   No current facility-administered medications on file prior to visit.     The following portions of the patient's history were reviewed and updated as appropriate: allergies, current medications, past family history, past medical history, past social history, past surgical history and problem list.  ROS Otherwise as in subjective above    Objective: BP 110/70   Pulse 63   Temp 97.6 F (36.4 C)   Wt 191 lb 6.4 oz (86.8 kg)   BMI 27.07 kg/m   General appearance: alert, no distress, well developed, well nourished Neck rotation to the right is decreased and neck extension is quite decreased, otherwise range of motion normal, supple,  nontender, no thyromegaly or lymphadenopathy HEENT: normocephalic, sclerae anicteric, conjunctiva pink and moist, TMs pearly, nares patent, no discharge or erythema, pharynx normal Oral cavity: MMM, no lesions Heart: RRR, normal S1, S2, no murmurs Lungs: CTA bilaterally, no wheezes, rhonchi, or rales  back nontender with normal range of motion Neuro: CN II to XII intact, nonfocal exam, normal arm strength and sensation DTRs Pulses: 2+ radial pulses, 2+ pedal pulses, normal cap refill Ext: no edema   Assessment: Encounter Diagnoses  Name Primary?   Neck pain Yes   Acute intractable headache, unspecified headache type    Decreased ROM of neck      Plan: We discussed his symptoms and concerns.  He has a history of cluster headaches but none in a while.  This appears to be acute headache.  Possibly stress related intention possibly due to torticollis as well.  Advised stretching, heat, medications below.  Caution with muscle laxer due to sedation.  Discussed proper use of medications.  Consider massage therapy or physical therapy referral.  Looking back he had an abnormal cervical spine MRI 2015 so very likely could have had worsening arthritis over the years.  If symptoms do not improve in the next week or if they continue over the next several weeks, consider updated neck imaging  [name] was seen today for  headache.  Diagnoses and all orders for this visit:  Neck pain  Acute intractable headache, unspecified headache type  Decreased ROM of neck  Other orders -     tiZANidine (ZANAFLEX) 4 MG tablet; Take 1 tablet (4 mg total) by mouth 2 (two) times daily as needed for muscle spasms. -     ibuprofen (ADVIL) 600 MG tablet; Take 1 tablet (600 mg total) by mouth every 8 (eight) hours as needed.    Follow up: As needed

## 2023-05-02 ENCOUNTER — Encounter: Payer: BC Managed Care – PPO | Admitting: Family Medicine

## 2023-05-14 ENCOUNTER — Other Ambulatory Visit: Payer: Self-pay | Admitting: Family Medicine

## 2023-05-14 DIAGNOSIS — E785 Hyperlipidemia, unspecified: Secondary | ICD-10-CM

## 2023-05-14 DIAGNOSIS — K219 Gastro-esophageal reflux disease without esophagitis: Secondary | ICD-10-CM

## 2023-07-30 ENCOUNTER — Encounter: Payer: Self-pay | Admitting: Family Medicine

## 2023-07-30 ENCOUNTER — Ambulatory Visit: Payer: BC Managed Care – PPO | Admitting: Family Medicine

## 2023-07-30 VITALS — BP 128/78 | HR 72 | Temp 97.5°F | Ht 70.0 in | Wt 192.4 lb

## 2023-07-30 DIAGNOSIS — L237 Allergic contact dermatitis due to plants, except food: Secondary | ICD-10-CM

## 2023-07-30 MED ORDER — PREDNISONE 10 MG PO TABS: 10.0000 mg | ORAL_TABLET | Freq: Every day | ORAL | 0 refills | Status: DC

## 2023-07-30 NOTE — Progress Notes (Signed)
Chief Complaint  Patient presents with   Rash    Started about a week ago while in yard. Arms, legs, chest and stomach. He said this is worse than the usual poison ivy that he usually gets.    7/28 (Sunday), he was doing yardwork. A few days later (Tues/Wed) he started getting a few spots--itchy bumps/rash.  He reports the rash has continued to spread. He is extremely uncomfortable, reporting "I'm on fire", hurts terribly and is itchy. Feels like whole body from head to toes is tingling.  Has had it bad like this in the past, but not for a long time Usually he has much milder symptoms, which he can control with drying it out with alcohol.  He thinks he had a large exposure. Unsure if exposure was same or different (poison ivy vs oak or sumac).  He believes he has taken prednisone in the past without adverse effects.   PMH, PSH, SH reviewed  Outpatient Encounter Medications as of 07/30/2023  Medication Sig Note   diphenhydrAMINE (BENADRYL) 25 MG tablet Take 25 mg by mouth every 6 (six) hours as needed. 07/30/2023: Took one yesterday   pantoprazole (PROTONIX) 40 MG tablet TAKE 1 TABLET(40 MG) BY MOUTH DAILY    rosuvastatin (CRESTOR) 10 MG tablet TAKE 1 TABLET(10 MG) BY MOUTH DAILY    [DISCONTINUED] predniSONE (DELTASONE) 10 MG tablet Take 1 tablet (10 mg total) by mouth daily with breakfast.    ibuprofen (ADVIL) 600 MG tablet Take 1 tablet (600 mg total) by mouth every 8 (eight) hours as needed. (Patient not taking: Reported on 07/30/2023) 07/30/2023: As needed   predniSONE (DELTASONE) 10 MG tablet Take 1 tablet (10 mg total) by mouth daily with breakfast. Take as directed, with breakfast.  6/5/4/4/3/3/3/2/2/2/1/1/1    [DISCONTINUED] tiZANidine (ZANAFLEX) 4 MG tablet Take 1 tablet (4 mg total) by mouth 2 (two) times daily as needed for muscle spasms. (Patient not taking: Reported on 07/30/2023)    No facility-administered encounter medications on file as of 07/30/2023.   NOT taking prednisone prior  to today's visit (the once daily with breakfast rx was sent in error).  No Known Allergies  ROS: no fever, chills, URI symptoms, mouth or throat swelling, shortness of breath, chest pain. +rash, burning/itching/painful as per HPI. No GI complaints or other concerns.    PHYSICAL EXAM:  BP 128/78   Pulse 72   Temp (!) 97.5 F (36.4 C) (Tympanic)   Ht 5\' 10"  (1.778 m)   Wt 192 lb 6.4 oz (87.3 kg)   BMI 27.61 kg/m   Patient appears moderately uncomfortable, pacing, trying not to scratch, very antsy. HEENT: conjunctiva and sclera are clear, EOMI. OP clear Heart: regular rate and rhythm Lungs: clear bilaterally Skin:  Multiple areas of linear excoriations and clusters of vesicles, papules with minimal surrounding erythema. Affected areas include right inner lower leg (large patches), just a few spots on the left R thumb, a few scattered papules/vesicles on R forearm, L forearm Some on chest (scattered papules/vesicles), large erythematous patch at L waistband. Psych: quite agitated today, due to discomfort.  Pacing, walking around. He has full range of affect, and denies depression or SI    ASSESSMENT/PLAN:  Plant allergic contact dermatitis - cont to get new lesions, requires systemic steroids. Risks/SE of prednisone reviewed. Avoid NSAIDs, take w/food. SE reviewed. s/sx infection reviewed, f/u prn - Plan: predniSONE (DELTASONE) 10 MG tablet, DISCONTINUED: predniSONE (DELTASONE) 10 MG tablet  He had mentioned feeling so awfully uncomfortable that  he would do "anything" to stop the discomfort, including killing himself. Explored further, not suicidal. Did discuss potential psych SE of prednisone in detail, and to stop if worsened moods/SI. Reviewed supportive measures in detail. He reports having adverse effect from benadryl (makes antsy, not sedating), so not recommended.  I spent 34 minutes dedicated to the care of this patient, including pre-visit review of records, face to  face time, post-visit ordering of testing and documentation.  Take prednisone once daily in the morning with food. Avoid ibuprofen/advil/motrin/aleve.  It is fine to take tylenol while taking the prednisone. Side effects are dose dependent--stronger the first few days when on the higher dose. You may use antihistamines (oral such as claritin or zyrtec, or topical such as calamine/caladryl lotions) You may use Aveeno baths, if needed for the itching.   You are being prescribed 10mg  prednisone tablets.  Take 6 pills today, 5 pills Tuesday, 4 pills on Wednesday and Thursday, 3 pills on Friday/Sat/Sun, 2 pills on Mon/Tues/Wed, 1 pill on Thurs/Fri/Sat  (6/5/4/4/3/3/3/2/2/2/1/1/1)  If you start having fever, increased areas of redness and swelling, pain, crusting this could indicate a cellulitis (skin infection) that may need an antibiotic. Follow up if persists/worsens.

## 2023-07-30 NOTE — Patient Instructions (Signed)
Take prednisone once daily in the morning with food. Avoid ibuprofen/advil/motrin/aleve.  It is fine to take tylenol while taking the prednisone. Side effects are dose dependent--stronger the first few days when on the higher dose. You may use antihistamines (oral such as claritin or zyrtec, or topical such as calamine/caladryl lotions) You may use Aveeno baths, if needed for the itching.   You are being prescribed 10mg  prednisone tablets.  Take 6 pills today, 5 pills Tuesday, 4 pills on Wednesday and Thursday, 3 pills on Friday/Sat/Sun, 2 pills on Mon/Tues/Wed, 1 pill on Thurs/Fri/Sat  (6/5/4/4/3/3/3/2/2/2/1/1/1)  If you start having fever, increased areas of redness and swelling, pain, crusting this could indicate a cellulitis (skin infection) that may need an antibiotic. Follow up if persists/worsens.

## 2023-08-13 ENCOUNTER — Other Ambulatory Visit: Payer: Self-pay | Admitting: Medical

## 2023-08-13 ENCOUNTER — Ambulatory Visit (INDEPENDENT_AMBULATORY_CARE_PROVIDER_SITE_OTHER): Payer: BC Managed Care – PPO | Admitting: Medical

## 2023-08-13 ENCOUNTER — Encounter: Payer: Self-pay | Admitting: Medical

## 2023-08-13 VITALS — BP 124/84 | HR 76 | Ht 70.0 in | Wt 194.6 lb

## 2023-08-13 DIAGNOSIS — L237 Allergic contact dermatitis due to plants, except food: Secondary | ICD-10-CM | POA: Diagnosis not present

## 2023-08-13 DIAGNOSIS — E785 Hyperlipidemia, unspecified: Secondary | ICD-10-CM

## 2023-08-13 DIAGNOSIS — K219 Gastro-esophageal reflux disease without esophagitis: Secondary | ICD-10-CM

## 2023-08-13 MED ORDER — CETIRIZINE HCL 10 MG PO TABS: 10.0000 mg | ORAL_TABLET | Freq: Every day | ORAL | 0 refills | Status: DC

## 2023-08-13 MED ORDER — ROSUVASTATIN CALCIUM 10 MG PO TABS: ORAL_TABLET | ORAL | 0 refills | Status: DC

## 2023-08-13 MED ORDER — FAMOTIDINE 20 MG PO TABS: 20.0000 mg | ORAL_TABLET | Freq: Two times a day (BID) | ORAL | 0 refills | Status: DC

## 2023-08-13 MED ORDER — METHYLPREDNISOLONE ACETATE 80 MG/ML IJ SUSP
80.0000 mg | Freq: Once | INTRAMUSCULAR | Status: AC
Start: 2023-08-13 — End: 2023-08-13
  Administered 2023-08-13: 80 mg via INTRAMUSCULAR

## 2023-08-13 MED ORDER — TRIAMCINOLONE ACETONIDE 0.5 % EX OINT: 1.0000 | TOPICAL_OINTMENT | Freq: Two times a day (BID) | CUTANEOUS | 1 refills | Status: DC

## 2023-08-13 MED ORDER — PANTOPRAZOLE SODIUM 40 MG PO TBEC: DELAYED_RELEASE_TABLET | ORAL | 0 refills | Status: DC

## 2023-08-13 NOTE — Patient Instructions (Addendum)
Recommendations: Begin cetirizine Zyrtec allergy pill daily at bedtime for the next week, but you can go longer if needed Begin famotidine Pepcid once or twice daily for the next week as this can also help with allergic reaction Begin triamcinolone topical ointment on your body except for your face.  This can help with irritation aggravated skin We gave you a injection of steroid today to further help resolve the symptoms Avoid taking any really hot showers or baths for the next week In addition to this you can also use a lotion such as Lubriderm, Cetaphil, Aquaphor or other similar moisturizing lotion daily

## 2023-08-13 NOTE — Progress Notes (Signed)
Subjective: Chief Complaint  Patient presents with   Poison Ivy    Treated with prednison pack, saw Dr. Lynelle Doctor 07/30/23. Everything is dried up but still itching and feels like his nerve endings are just right under his skin.    Here for recheck on poison ivy.  3 weeks ago got poison ivy, got heavy into it and had rash all over.  He is allergic to poison ivy.  He recently came in here 2 weeks ago for evaluation.  Was given longer steroid pack, the initial pack plus another week of a lower dose.  Although a lot of rash is cleared up he still feels sensation on his skin throughout, burning on fire sensation.  Despite that the rash is mostly resolved he just feels uneasy under his skin throughout like he still has a reaction.  This is a worse reaction and prior in the past.  The bumps he had is gone away but he still feels irritated on the top layer of skin.  He feels scratchy and irritated on his left shoulder and back.  He cannot take Benadryl as it makes him jittery.  No fever, no shortness of breath or wheezing, no swelling.  No other aggravating or relieving factors. No other complaint.  Past Medical History:  Diagnosis Date   Chest discomfort    Chronic headaches    GERD (gastroesophageal reflux disease)    High cholesterol    Seizure ([facility])    15 years ago; seizure only x2 per pt   Current Outpatient Medications on File Prior to Visit  Medication Sig Dispense Refill   ibuprofen (ADVIL) 600 MG tablet Take 1 tablet (600 mg total) by mouth every 8 (eight) hours as needed. 30 tablet 0   predniSONE (DELTASONE) 10 MG tablet Take 1 tablet (10 mg total) by mouth daily with breakfast. Take as directed, with breakfast.  6/5/4/4/3/3/3/2/2/2/1/1/1 (Patient not taking: Reported on 08/13/2023) 37 tablet 0   No current facility-administered medications on file prior to visit.   ROS as in subjective   Objective BP 124/84   Pulse 76   Ht 5\' 10"  (1.778 m)   Wt 194 lb 9.6 oz (88.3 kg)   BMI 27.92  kg/m   Gen: wd, wn, nad Pinkish patches of skin on his thighs, left forearm and mid back where he had recent poison ivy dermatitis   Encounter Diagnoses  Name Primary?   Gastroesophageal reflux disease, unspecified whether esophagitis present    Poison ivy dermatitis Yes   Hyperlipidemia, unspecified hyperlipidemia type     Plan: We discussed findings and symptoms.  Methylprednisolone 80 mg IM injection given today.  Discussed the regimen below, risk and benefits and proper use of medication.  Patient Instructions  Recommendations: Begin cetirizine Zyrtec allergy pill daily at bedtime for the next week, but you can go longer if needed Begin famotidine Pepcid once or twice daily for the next week as this can also help with allergic reaction Begin triamcinolone topical ointment on your body except for your face.  This can help with irritation aggravated skin We gave you a injection of steroid today to further help resolve the symptoms Avoid taking any really hot showers or baths for the next week In addition to this you can also use a lotion such as Lubriderm, Cetaphil, Aquaphor or other similar moisturizing lotion daily     Refilled Protonix and Crestor today has request  Akintunde was seen today for poison ivy.  Diagnoses and all orders for  this visit:  Poison ivy dermatitis  Gastroesophageal reflux disease, unspecified whether esophagitis present -     pantoprazole (PROTONIX) 40 MG tablet; TAKE 1 TABLET(40 MG) BY MOUTH DAILY  Hyperlipidemia, unspecified hyperlipidemia type -     rosuvastatin (CRESTOR) 10 MG tablet; TAKE 1 TABLET(10 MG) BY MOUTH DAILY  Other orders -     triamcinolone ointment (KENALOG) 0.5 %; Apply 1 Application topically 2 (two) times daily. -     famotidine (PEPCID) 20 MG tablet; Take 1 tablet (20 mg total) by mouth 2 (two) times daily. -     cetirizine (ZYRTEC) 10 MG tablet; Take 1 tablet (10 mg total) by mouth at bedtime.     Call or recheck if  not much improved within the next 72 hours, follow-up soon for physical as scheduled

## 2023-08-13 NOTE — Addendum Note (Signed)
Addended by: Debbrah Alar F on: 08/13/2023 11:39 AM   Modules accepted: Orders

## 2023-08-30 ENCOUNTER — Encounter: Payer: Self-pay | Admitting: Family Medicine

## 2023-08-30 ENCOUNTER — Ambulatory Visit (INDEPENDENT_AMBULATORY_CARE_PROVIDER_SITE_OTHER): Payer: BC Managed Care – PPO | Admitting: Family Medicine

## 2023-08-30 VITALS — BP 124/66 | HR 68 | Ht 70.0 in | Wt 191.6 lb

## 2023-08-30 DIAGNOSIS — Z0000 Encounter for general adult medical examination without abnormal findings: Secondary | ICD-10-CM | POA: Diagnosis not present

## 2023-08-30 DIAGNOSIS — K219 Gastro-esophageal reflux disease without esophagitis: Secondary | ICD-10-CM

## 2023-08-30 DIAGNOSIS — G43109 Migraine with aura, not intractable, without status migrainosus: Secondary | ICD-10-CM

## 2023-08-30 DIAGNOSIS — Z23 Encounter for immunization: Secondary | ICD-10-CM

## 2023-08-30 DIAGNOSIS — G44219 Episodic tension-type headache, not intractable: Secondary | ICD-10-CM

## 2023-08-30 DIAGNOSIS — Z87898 Personal history of other specified conditions: Secondary | ICD-10-CM

## 2023-08-30 DIAGNOSIS — M1990 Unspecified osteoarthritis, unspecified site: Secondary | ICD-10-CM

## 2023-08-30 DIAGNOSIS — E782 Mixed hyperlipidemia: Secondary | ICD-10-CM

## 2023-08-30 NOTE — Progress Notes (Signed)
Complete physical exam  Patient: Mitchell Ruiz   DOB: 26-May-1961   62 y.o. Male  MRN: [id number]  Subjective:    Chief Complaint  Patient presents with   Annual Exam    Fasting annual exam. No new concerns.     Mitchell Ruiz is a 62 y.o. male who presents today for a complete physical exam. He reports consuming a general diet. The patient does not participate in regular exercise at present. He generally feels well. He reports sleeping poorly. He does complain of intermittent pain in his knees and back but this is usually revolving around physical activity that does tend to quiet down as he becomes more sedentary.  He has remote history of seizures but none in the last 15 years.  He also has a history of migraine but has not had any symptoms of that in a long period of time.  He has occasionally used ED medication.  Continues on Protonix for reflux symptoms and also taking Crestor.  His marriage and home life is going well.  Most recent fall risk assessment:    08/30/2023    1:38 PM  Fall Risk   Falls in the past year? 0  Number falls in past yr: 0  Injury with Fall? 0  Risk for fall due to : No Fall Risks  Follow up Falls evaluation completed     Most recent depression screenings:    08/30/2023    1:38 PM 04/26/2022    9:12 AM  PHQ 2/9 Scores  PHQ - 2 Score 0 0    Vision:Not within last year     Patient Care Team: Ronnald Nian, MD as PCP - General (Family Medicine)   Outpatient Medications Prior to Visit  Medication Sig   pantoprazole (PROTONIX) 40 MG tablet TAKE 1 TABLET(40 MG) BY MOUTH DAILY   rosuvastatin (CRESTOR) 10 MG tablet TAKE 1 TABLET(10 MG) BY MOUTH DAILY   [DISCONTINUED] cetirizine (ZYRTEC) 10 MG tablet Take 1 tablet (10 mg total) by mouth at bedtime.   [DISCONTINUED] famotidine (PEPCID) 20 MG tablet Take 1 tablet (20 mg total) by mouth 2 (two) times daily.   [DISCONTINUED] ibuprofen (ADVIL) 600 MG tablet Take 1 tablet (600 mg total) by mouth every 8  (eight) hours as needed.   [DISCONTINUED] predniSONE (DELTASONE) 10 MG tablet Take 1 tablet (10 mg total) by mouth daily with breakfast. Take as directed, with breakfast.  6/5/4/4/3/3/3/2/2/2/1/1/1 (Patient not taking: Reported on 08/13/2023)   [DISCONTINUED] triamcinolone ointment (KENALOG) 0.5 % Apply 1 Application topically 2 (two) times daily.   No facility-administered medications prior to visit.    Review of Systems  All other systems reviewed and are negative.         Objective:     BP 124/66   Pulse 68   Ht 5\' 10"  (1.778 m)   Wt 191 lb 9.6 oz (86.9 kg)   BMI 27.49 kg/m    Physical Exam  Alert and in no distress. Tympanic membranes and canals are normal. Pharyngeal area is normal. Neck is supple without adenopathy or thyromegaly. Cardiac exam shows a regular sinus rhythm without murmurs or gallops. Lungs are clear to auscultation. Exam of the knees does show some slight lipping laterally on the left.     Assessment & Plan:    Routine general medical examination at a health care facility - Plan: CBC with Differential/Platelet, Comprehensive metabolic panel, Lipid panel  Migraine with aura and without status migrainosus, not intractable  Mixed hyperlipidemia - Plan: Lipid panel  History of seizure  Gastroesophageal reflux disease without esophagitis  Episodic tension-type headache, not intractable  Arthritis  Need for Tdap vaccination - Plan: Tdap vaccine greater than or equal to 7yo IM  Need for influenza vaccination - Plan: Flu vaccine trivalent PF, 6mos and older(Flulaval,Afluria,Fluarix,Fluzone)  Immunization History  Administered Date(s) Administered   DT (Pediatric) 03/16/1994   Influenza Split 12/08/2011, 08/29/2012   Influenza, Seasonal, Injecte, Preservative Fre 08/30/2023   Influenza,inj,Quad PF,6+ Mos 01/19/2015, 11/29/2017, 10/16/2019   PFIZER(Purple Top)SARS-COV-2 Vaccination 03/04/2020, 03/25/2020, 11/27/2020   Td 03/16/1994   Tdap  08/29/2012, 08/30/2023   Zoster Recombinant(Shingrix) 04/26/2022   Zoster, Live 04/05/2015    Health Maintenance  Topic Date Due   HIV Screening  Never done   Zoster Vaccines- Shingrix (2 of 2) 06/21/2022   COVID-19 Vaccine (4 - 2023-24 season) 09/15/2023 (Originally 08/26/2023)   Colonoscopy  09/15/2028   DTaP/Tdap/Td (5 - Td or Tdap) 08/29/2033   INFLUENZA VACCINE  Completed   Hepatitis C Screening  Completed   HPV VACCINES  Aged Out    Discussed health benefits of physical activity, and encouraged him to engage in regular exercise appropriate for his age and condition.  Also discussed good range of motion exercises to help with his back and his knees.  Explained that since the symptoms tend to come and go this is probably more of an arthritic thing that would respond nicely to Tylenol.  Also if he needs a refill on his ED medication to not hesitate to call.  Problem List Items Addressed This Visit     Arthritis   Episodic tension-type headache, not intractable   GERD (gastroesophageal reflux disease)   History of seizure   Hyperlipidemia   Relevant Orders   Lipid panel   Migraine with aura and without status migrainosus, not intractable   Other Visit Diagnoses     Routine general medical examination at a health care facility    -  Primary   Relevant Orders   CBC with Differential/Platelet   Comprehensive metabolic panel   Lipid panel   Need for Tdap vaccination       Relevant Orders   Tdap vaccine greater than or equal to 7yo IM (Completed)   Need for influenza vaccination       Relevant Orders   Flu vaccine trivalent PF, 6mos and older(Flulaval,Afluria,Fluarix,Fluzone) (Completed)      Follow-up 1 year.    Sharlot Gowda, MD

## 2023-08-31 LAB — LIPID PANEL
Chol/HDL Ratio: 2.8 ratio (ref 0.0–5.0)
Cholesterol, Total: 172 mg/dL (ref 100–199)
HDL: 62 mg/dL (ref >39)
LDL Chol Calc (NIH): 97 mg/dL (ref 0–99)
Triglycerides: 65 mg/dL (ref 0–149)
VLDL Cholesterol Cal: 13 mg/dL (ref 5–40)

## 2023-08-31 LAB — COMPREHENSIVE METABOLIC PANEL
ALT: 16 IU/L (ref 0–44)
AST: 17 IU/L (ref 0–40)
Albumin: 4.5 g/dL (ref 3.9–4.9)
Alkaline Phosphatase: 44 IU/L (ref 44–121)
BUN/Creatinine Ratio: 17 (ref 10–24)
BUN: 18 mg/dL (ref 8–27)
Bilirubin Total: 0.6 mg/dL (ref 0.0–1.2)
CO2: 26 mmol/L (ref 20–29)
Calcium: 9.5 mg/dL (ref 8.6–10.2)
Chloride: 102 mmol/L (ref 96–106)
Creatinine, Ser: 1.04 mg/dL (ref 0.76–1.27)
Globulin, Total: 2.7 g/dL (ref 1.5–4.5)
Glucose: 86 mg/dL (ref 70–99)
Potassium: 4.7 mmol/L (ref 3.5–5.2)
Sodium: 142 mmol/L (ref 134–144)
Total Protein: 7.2 g/dL (ref 6.0–8.5)
eGFR: 82 mL/min/{1.73_m2} (ref >59)

## 2023-08-31 LAB — CBC WITH DIFFERENTIAL/PLATELET
Basophils Absolute: 0 10*3/uL (ref 0.0–0.2)
Basos: 1 %
EOS (ABSOLUTE): 0.2 10*3/uL (ref 0.0–0.4)
Eos: 3 %
Hematocrit: 38.8 % (ref 37.5–51.0)
Hemoglobin: 12.9 g/dL — ABNORMAL LOW (ref 13.0–17.7)
Immature Grans (Abs): 0 10*3/uL (ref 0.0–0.1)
Immature Granulocytes: 0 %
Lymphocytes Absolute: 1.3 10*3/uL (ref 0.7–3.1)
Lymphs: 21 %
MCH: 29.5 pg (ref 26.6–33.0)
MCHC: 33.2 g/dL (ref 31.5–35.7)
MCV: 89 fL (ref 79–97)
Monocytes Absolute: 0.6 10*3/uL (ref 0.1–0.9)
Monocytes: 10 %
Neutrophils Absolute: 4 10*3/uL (ref 1.4–7.0)
Neutrophils: 65 %
Platelets: 255 10*3/uL (ref 150–450)
RBC: 4.38 x10E6/uL (ref 4.14–5.80)
RDW: 13 % (ref 11.6–15.4)
WBC: 6.1 10*3/uL (ref 3.4–10.8)

## 2023-10-03 ENCOUNTER — Ambulatory Visit (INDEPENDENT_AMBULATORY_CARE_PROVIDER_SITE_OTHER): Payer: BC Managed Care – PPO | Admitting: Family Medicine

## 2023-10-03 VITALS — BP 130/70 | HR 72 | Temp 98.4°F | Wt 190.0 lb

## 2023-10-03 DIAGNOSIS — R350 Frequency of micturition: Secondary | ICD-10-CM | POA: Diagnosis not present

## 2023-10-03 DIAGNOSIS — R3989 Other symptoms and signs involving the genitourinary system: Secondary | ICD-10-CM | POA: Diagnosis not present

## 2023-10-03 DIAGNOSIS — R109 Unspecified abdominal pain: Secondary | ICD-10-CM | POA: Diagnosis not present

## 2023-10-03 DIAGNOSIS — K420 Umbilical hernia with obstruction, without gangrene: Secondary | ICD-10-CM | POA: Diagnosis not present

## 2023-10-03 LAB — POCT URINALYSIS DIP (PROADVANTAGE DEVICE)
Bilirubin, UA: NEGATIVE
Blood, UA: NEGATIVE
Glucose, UA: NEGATIVE mg/dL
Ketones, POC UA: NEGATIVE mg/dL
Leukocytes, UA: NEGATIVE
Nitrite, UA: NEGATIVE
Protein Ur, POC: NEGATIVE mg/dL
Specific Gravity, Urine: 1.005
Urobilinogen, Ur: 0.2
pH, UA: 6 (ref 5.0–8.0)

## 2023-10-03 NOTE — Progress Notes (Signed)
   Subjective:    Patient ID: Mitchell Ruiz, male    DOB: [birth year], 62 y.o.   MRN: [id number]  HPI He is here for consult concerning multiple issues.  He initially talked about a 3-week history of mid abdominal aching that does get worse if he lies on 1 side or the other and also notes that when he lies on his belly that the pain will get worse.  He does have a previous history of umbilical hernia.  He then mentions the fact that after he eats sometimes he does feel abdominal discomfort but no nausea or vomiting.  He has tried Pepto-Bismol and Align with no improvement.  We felt the abdominal discomfort might be related to BMs but states that he does have relatively normal BMs and that we would have 1 that would not relieve his discomfort.  He then described an issue of bladder pressure that occurred several hours after sexual activity.  This got him quite concerned and ended up having a panic attack because of this.  He did try an over-the-counter bladder medication with no results.  No dysuria frequency or discharge.   Review of Systems     Objective:    Physical Exam Alert and in no distress.  Abdominal exam does show an umbilical hernia present.  Active bowel sounds.  No masses or tenderness noted.  Urinalysis was negative.       Assessment & Plan:  Stomach pain - Plan: POCT Urinalysis DIP (Proadvantage Device)  Increased frequency of urination - Plan: POCT Urinalysis DIP (Proadvantage Device)  Sensation of pressure in bladder area - Plan: POCT Urinalysis DIP (Proadvantage Device)  Umbilical hernia with obstruction, without gangrene I explained that it is difficult to say what is causing all the symptoms that there is no 1 thing that seems to be the diagnostic issue.  Discussed the fact that 1 him to pay attention to the symptoms in terms of what makes it better or what makes it worse and to avoid trying to assess what he thinks is causing it but stick to what symptoms he is  having. Lets work on the hernia and push the hernia back in and see what effect that has on your abdominal symptoms In terms of the bowel issue start taking MiraLAX regularly Take  1pantoprazole twice per day Pay attention to any symptoms that you have what makes it better or worse.  Over 40 minutes spent discussing all of these issues with him.

## 2023-10-03 NOTE — Patient Instructions (Addendum)
Lets work on the hernia and push the hernia back in and see what effect that has on your abdominal symptoms In terms of the bowel issue start taking MiraLAX regularly Take  1pantoprazole twice per day Pay attention to any symptoms that you have what makes it better or worse

## 2023-10-11 ENCOUNTER — Ambulatory Visit: Payer: BC Managed Care – PPO | Admitting: Family Medicine

## 2023-10-18 ENCOUNTER — Other Ambulatory Visit: Payer: Self-pay | Admitting: Family Medicine

## 2023-10-18 DIAGNOSIS — E785 Hyperlipidemia, unspecified: Secondary | ICD-10-CM

## 2023-11-14 ENCOUNTER — Other Ambulatory Visit: Payer: Self-pay | Admitting: Medical

## 2023-11-14 DIAGNOSIS — K219 Gastro-esophageal reflux disease without esophagitis: Secondary | ICD-10-CM

## 2023-11-14 DIAGNOSIS — E785 Hyperlipidemia, unspecified: Secondary | ICD-10-CM

## 2024-02-19 ENCOUNTER — Encounter: Payer: Self-pay | Admitting: Internal Medicine

## 2024-05-10 ENCOUNTER — Other Ambulatory Visit: Payer: Self-pay | Admitting: Medical

## 2024-05-10 DIAGNOSIS — E785 Hyperlipidemia, unspecified: Secondary | ICD-10-CM

## 2024-05-10 DIAGNOSIS — K219 Gastro-esophageal reflux disease without esophagitis: Secondary | ICD-10-CM

## 2024-08-11 DIAGNOSIS — M1712 Unilateral primary osteoarthritis, left knee: Secondary | ICD-10-CM | POA: Diagnosis not present

## 2024-08-11 DIAGNOSIS — M545 Low back pain, unspecified: Secondary | ICD-10-CM | POA: Diagnosis not present

## 2024-09-09 ENCOUNTER — Ambulatory Visit (INDEPENDENT_AMBULATORY_CARE_PROVIDER_SITE_OTHER): Admitting: Family Medicine

## 2024-09-09 ENCOUNTER — Encounter: Payer: Self-pay | Admitting: Family Medicine

## 2024-09-09 VITALS — BP 110/72 | HR 74 | Ht 70.0 in | Wt 190.4 lb

## 2024-09-09 DIAGNOSIS — Z23 Encounter for immunization: Secondary | ICD-10-CM

## 2024-09-09 DIAGNOSIS — K219 Gastro-esophageal reflux disease without esophagitis: Secondary | ICD-10-CM

## 2024-09-09 DIAGNOSIS — D229 Melanocytic nevi, unspecified: Secondary | ICD-10-CM

## 2024-09-09 DIAGNOSIS — Z0000 Encounter for general adult medical examination without abnormal findings: Secondary | ICD-10-CM

## 2024-09-09 DIAGNOSIS — M25561 Pain in right knee: Secondary | ICD-10-CM

## 2024-09-09 DIAGNOSIS — E785 Hyperlipidemia, unspecified: Secondary | ICD-10-CM

## 2024-09-09 DIAGNOSIS — G8929 Other chronic pain: Secondary | ICD-10-CM

## 2024-09-09 DIAGNOSIS — M25562 Pain in left knee: Secondary | ICD-10-CM

## 2024-09-09 DIAGNOSIS — D649 Anemia, unspecified: Secondary | ICD-10-CM | POA: Diagnosis not present

## 2024-09-09 LAB — LIPID PANEL

## 2024-09-09 MED ORDER — PANTOPRAZOLE SODIUM 40 MG PO TBEC: DELAYED_RELEASE_TABLET | ORAL | 1 refills | Status: DC

## 2024-09-09 MED ORDER — ROSUVASTATIN CALCIUM 10 MG PO TABS: ORAL_TABLET | ORAL | 1 refills | Status: AC

## 2024-09-09 NOTE — Progress Notes (Signed)
 Name: Mitchell Ruiz   Date of Visit: 09/09/24   Date of last visit with me: Visit date not found   CHIEF COMPLAINT:  Chief Complaint  Patient presents with   Annual Exam    Cpe. Fasting. Has 2 bad knees, having knee replacement surgery in December.        HPI:  Discussed the use of AI scribe software for clinical note transcription with the patient, who gave verbal consent to proceed.  History of Present Illness   Mitchell Ruiz is a 63 year old male with bilateral knee osteoarthritis who presents for management of knee pain and pre-operative planning for knee replacement surgery.  He has chronic bilateral knee pain, with the right knee scheduled for replacement in December. The pain is significant in both knees, with the left knee described as 'bowed out' and causing functional issues. The patient reports that his right knee is painful and has been described as 'bone on bone' by his orthopedic surgeon, and he experiences swelling in the knee. A steroid injection in the left knee approximately four weeks ago provided some relief. He plans to discuss further injections with his orthopedic surgeon on October 8th.  The pain worsens with physical activity, such as working in the yard, and the left knee becomes more painful during these activities. He is considering using a knee compression sleeve to manage swelling and provide stability, and believes his wife may have purchased one for him. He is planning a trip [place] next month and is considering the timing of further injections and the use of knee compression sleeves for both knees to manage pain during travel.  His current medications include Protonix  and Crestor , which he takes regularly, and he is due for refills. He has previously received a shingles vaccine and is due for the second dose, as well as a flu vaccine, which he plans to receive today.  He has a history of anemia noted in previous lab work, which is being monitored.  He has not received a COVID vaccine recently but has had COVID in the past with mild symptoms.  He also mentions having moles and skin tags, with a history of dermatological evaluation two years ago, and is considering further dermatological consultation.         OBJECTIVE:       09/09/2024    8:58 AM  Depression screen PHQ 2/9  Decreased Interest 0  Down, Depressed, Hopeless 0  PHQ - 2 Score 0     BP Readings from Last 3 Encounters:  09/09/24 110/72  10/03/23 130/70  08/30/23 124/66    BP 110/72   Pulse 74   Ht 5' 10 (1.778 m)   Wt 190 lb 6.4 oz (86.4 kg)   SpO2 97%   BMI 27.32 kg/m    Physical Exam   MUSCULOSKELETAL: Right knee swollen.      Physical Exam Constitutional:      Appearance: Normal appearance.  Neurological:     General: No focal deficit present.     Mental Status: He is alert and oriented to person, place, and time. Mental status is at baseline.     ASSESSMENT/PLAN:   Assessment & Plan Annual physical exam  Flu vaccine need  Anemia, unspecified type  Hyperlipidemia, unspecified hyperlipidemia type  Chronic pain of both knees  Benign mole  Gastroesophageal reflux disease, unspecified whether esophagitis present    Assessment and Plan    Bilateral knee osteoarthritis Chronic bilateral knee osteoarthritis with  significant pain and functional impairment. Right knee replacement scheduled for December. Left knee shows bone-on-bone contact and swelling. - Recommend knee compression sleeves for both knees during work and travel. - Consider steroid injection in right knee for temporary relief. - Follow up with orthopedic specialist.  Anemia, unspecified Slight anemia noted in previous labs without significant symptoms. - Order CBC and basic labs to assess anemia status.  Hyperlipidemia, unspecified Chronic hyperlipidemia managed with Crestor . - Continue Crestor  as prescribed. - Order annual cholesterol and basic  labs.  Gastroesophageal reflux disease (GERD) Chronic GERD managed with Protonix . - Continue Protonix  as prescribed.  Skin lesions and skin tags Presence of skin lesions and skin tags, including a bleeding mole on the scalp. Previous dermatology evaluation two years ago. - Refer to dermatology for evaluation and possible removal.         Waldo Damian A. Vita MD [facility]

## 2024-09-10 ENCOUNTER — Ambulatory Visit: Payer: Self-pay | Admitting: Family Medicine

## 2024-09-10 LAB — CBC WITH DIFFERENTIAL/PLATELET
Basophils Absolute: 0 x10E3/uL (ref 0.0–0.2)
Basos: 1 %
EOS (ABSOLUTE): 0.1 x10E3/uL (ref 0.0–0.4)
Eos: 3 %
Hematocrit: 41.1 % (ref 37.5–51.0)
Hemoglobin: 13.3 g/dL (ref 13.0–17.7)
Immature Grans (Abs): 0 x10E3/uL (ref 0.0–0.1)
Immature Granulocytes: 0 %
Lymphocytes Absolute: 1.2 x10E3/uL (ref 0.7–3.1)
Lymphs: 25 %
MCH: 29.2 pg (ref 26.6–33.0)
MCHC: 32.4 g/dL (ref 31.5–35.7)
MCV: 90 fL (ref 79–97)
Monocytes Absolute: 0.4 x10E3/uL (ref 0.1–0.9)
Monocytes: 9 %
Neutrophils Absolute: 3 x10E3/uL (ref 1.4–7.0)
Neutrophils: 62 %
Platelets: 277 x10E3/uL (ref 150–450)
RBC: 4.56 x10E6/uL (ref 4.14–5.80)
RDW: 12.8 % (ref 11.6–15.4)
WBC: 4.8 x10E3/uL (ref 3.4–10.8)

## 2024-09-10 LAB — LIPID PANEL
Cholesterol, Total: 196 mg/dL (ref 100–199)
HDL: 45 mg/dL (ref >39)
LDL CALC COMMENT:: 4.4 ratio (ref 0.0–5.0)
LDL Chol Calc (NIH): 129 mg/dL — AB (ref 0–99)
Triglycerides: 125 mg/dL (ref 0–149)
VLDL Cholesterol Cal: 22 mg/dL (ref 5–40)

## 2024-09-10 LAB — COMPREHENSIVE METABOLIC PANEL WITH GFR
ALT: 18 IU/L (ref 0–44)
AST: 18 IU/L (ref 0–40)
Albumin: 4.8 g/dL (ref 3.9–4.9)
Alkaline Phosphatase: 44 IU/L — ABNORMAL LOW (ref 47–123)
BUN/Creatinine Ratio: 17 (ref 10–24)
BUN: 19 mg/dL (ref 8–27)
Bilirubin Total: 0.5 mg/dL (ref 0.0–1.2)
CO2: 21 mmol/L (ref 20–29)
Calcium: 9.1 mg/dL (ref 8.6–10.2)
Chloride: 102 mmol/L (ref 96–106)
Creatinine, Ser: 1.1 mg/dL (ref 0.76–1.27)
Globulin, Total: 2.2 g/dL (ref 1.5–4.5)
Glucose: 94 mg/dL (ref 70–99)
Potassium: 4.5 mmol/L (ref 3.5–5.2)
Sodium: 138 mmol/L (ref 134–144)
Total Protein: 7 g/dL (ref 6.0–8.5)
eGFR: 76 mL/min/1.73 (ref >59)

## 2024-09-10 LAB — HIV ANTIBODY (ROUTINE TESTING W REFLEX): HIV Screen 4th Generation wRfx: NONREACTIVE

## 2024-09-23 ENCOUNTER — Telehealth: Payer: Self-pay | Admitting: Family Medicine

## 2024-09-23 NOTE — Telephone Encounter (Signed)
 Copied from CRM #[id number]. Topic: General - Other >> Sep 23, 2024 12:01 PM Ahlexyia S wrote: Reason for CRM: Dagoberto from [employer] called in stating she received the clearance for pt surgery but is needing to receive pt office notes and any labs that have been done in the last 90 days. Informed Dagoberto that someone will give her a call soon. Callback [phone]  Faxed surgical form and notes and labs to [facility]  advised Dagoberto information faxed

## 2024-10-01 DIAGNOSIS — M25561 Pain in right knee: Secondary | ICD-10-CM | POA: Diagnosis not present

## 2024-10-01 DIAGNOSIS — M1712 Unilateral primary osteoarthritis, left knee: Secondary | ICD-10-CM | POA: Diagnosis not present

## 2024-10-08 ENCOUNTER — Encounter: Payer: BC Managed Care – PPO | Admitting: Family Medicine

## 2024-11-12 DIAGNOSIS — M1712 Unilateral primary osteoarthritis, left knee: Secondary | ICD-10-CM | POA: Diagnosis not present

## 2024-11-23 ENCOUNTER — Other Ambulatory Visit: Payer: Self-pay | Admitting: Family Medicine

## 2024-11-23 DIAGNOSIS — K219 Gastro-esophageal reflux disease without esophagitis: Secondary | ICD-10-CM

## 2024-11-24 DIAGNOSIS — M25662 Stiffness of left knee, not elsewhere classified: Secondary | ICD-10-CM | POA: Diagnosis not present

## 2024-11-24 DIAGNOSIS — M1712 Unilateral primary osteoarthritis, left knee: Secondary | ICD-10-CM | POA: Diagnosis not present

## 2024-11-27 DIAGNOSIS — M1712 Unilateral primary osteoarthritis, left knee: Secondary | ICD-10-CM | POA: Diagnosis not present

## 2025-09-14 ENCOUNTER — Encounter: Payer: Self-pay | Admitting: Family Medicine
